# Patient Record
Sex: Female | Born: 1935 | Race: White | Hispanic: No | Smoking: Never smoker
Health system: Southern US, Community
[De-identification: ages and names within clinical notes are randomized; demographics above are authoritative.]

## PROBLEM LIST (undated history)

## (undated) DIAGNOSIS — I495 Sick sinus syndrome: Secondary | ICD-10-CM

## (undated) DIAGNOSIS — E039 Hypothyroidism, unspecified: Secondary | ICD-10-CM

## (undated) DIAGNOSIS — K279 Peptic ulcer, site unspecified, unspecified as acute or chronic, without hemorrhage or perforation: Secondary | ICD-10-CM

## (undated) DIAGNOSIS — F0390 Unspecified dementia without behavioral disturbance: Secondary | ICD-10-CM

## (undated) DIAGNOSIS — D649 Anemia, unspecified: Secondary | ICD-10-CM

## (undated) DIAGNOSIS — J449 Chronic obstructive pulmonary disease, unspecified: Secondary | ICD-10-CM

## (undated) DIAGNOSIS — R627 Adult failure to thrive: Secondary | ICD-10-CM

## (undated) DIAGNOSIS — F32A Depression, unspecified: Secondary | ICD-10-CM

## (undated) DIAGNOSIS — I4891 Unspecified atrial fibrillation: Secondary | ICD-10-CM

## (undated) DIAGNOSIS — R8281 Pyuria: Secondary | ICD-10-CM

## (undated) DIAGNOSIS — F329 Major depressive disorder, single episode, unspecified: Secondary | ICD-10-CM

## (undated) DIAGNOSIS — E119 Type 2 diabetes mellitus without complications: Secondary | ICD-10-CM

## (undated) DIAGNOSIS — E785 Hyperlipidemia, unspecified: Secondary | ICD-10-CM

## (undated) HISTORY — DX: Major depressive disorder, single episode, unspecified: F32.9

## (undated) HISTORY — DX: Peptic ulcer, site unspecified, unspecified as acute or chronic, without hemorrhage or perforation: K27.9

## (undated) HISTORY — DX: Hyperlipidemia, unspecified: E78.5

## (undated) HISTORY — DX: Type 2 diabetes mellitus without complications: E11.9

## (undated) HISTORY — DX: Chronic obstructive pulmonary disease, unspecified: J44.9

## (undated) HISTORY — DX: Sick sinus syndrome: I49.5

## (undated) HISTORY — DX: Depression, unspecified: F32.A

## (undated) HISTORY — DX: Unspecified dementia, unspecified severity, without behavioral disturbance, psychotic disturbance, mood disturbance, and anxiety: F03.90

## (undated) HISTORY — DX: Anemia, unspecified: D64.9

## (undated) HISTORY — DX: Pyuria: R82.81

## (undated) HISTORY — DX: Unspecified atrial fibrillation: I48.91

## (undated) HISTORY — DX: Hypothyroidism, unspecified: E03.9

## (undated) HISTORY — DX: Adult failure to thrive: R62.7

---
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
## 2004-07-09 ENCOUNTER — Emergency Department: Payer: Self-pay | Admitting: Emergency Medicine

## 2004-10-29 ENCOUNTER — Ambulatory Visit: Payer: Self-pay | Admitting: Family Medicine

## 2005-06-10 ENCOUNTER — Ambulatory Visit: Payer: Self-pay | Admitting: Unknown Physician Specialty

## 2006-05-20 ENCOUNTER — Ambulatory Visit: Payer: Self-pay | Admitting: Family Medicine

## 2006-07-06 ENCOUNTER — Other Ambulatory Visit: Payer: Self-pay

## 2006-07-07 ENCOUNTER — Inpatient Hospital Stay: Payer: Self-pay | Admitting: Internal Medicine

## 2006-07-11 ENCOUNTER — Other Ambulatory Visit: Payer: Self-pay

## 2006-10-12 ENCOUNTER — Emergency Department: Payer: Self-pay | Admitting: Emergency Medicine

## 2006-11-19 ENCOUNTER — Emergency Department: Payer: Self-pay

## 2007-02-26 ENCOUNTER — Other Ambulatory Visit: Payer: Self-pay

## 2007-02-26 ENCOUNTER — Emergency Department: Payer: Self-pay | Admitting: Unknown Physician Specialty

## 2007-04-26 ENCOUNTER — Observation Stay: Payer: Self-pay | Admitting: Internal Medicine

## 2007-06-17 ENCOUNTER — Ambulatory Visit: Payer: Self-pay | Admitting: Gastroenterology

## 2007-12-09 ENCOUNTER — Other Ambulatory Visit: Payer: Self-pay

## 2007-12-09 ENCOUNTER — Inpatient Hospital Stay: Payer: Self-pay | Admitting: Internal Medicine

## 2008-02-29 ENCOUNTER — Ambulatory Visit: Payer: Self-pay | Admitting: Family Medicine

## 2008-07-20 ENCOUNTER — Ambulatory Visit: Payer: Self-pay | Admitting: Ophthalmology

## 2008-08-08 ENCOUNTER — Ambulatory Visit: Payer: Self-pay | Admitting: Ophthalmology

## 2009-05-17 ENCOUNTER — Inpatient Hospital Stay: Payer: Self-pay | Admitting: Internal Medicine

## 2009-09-29 ENCOUNTER — Emergency Department: Payer: Self-pay | Admitting: Emergency Medicine

## 2009-09-30 ENCOUNTER — Emergency Department: Payer: Self-pay | Admitting: Emergency Medicine

## 2009-11-01 ENCOUNTER — Inpatient Hospital Stay: Payer: Self-pay | Admitting: Internal Medicine

## 2009-11-21 ENCOUNTER — Emergency Department: Payer: Self-pay | Admitting: Emergency Medicine

## 2009-12-17 ENCOUNTER — Emergency Department: Payer: Self-pay | Admitting: Emergency Medicine

## 2010-01-21 ENCOUNTER — Emergency Department: Payer: Self-pay | Admitting: Emergency Medicine

## 2010-01-30 ENCOUNTER — Inpatient Hospital Stay: Payer: Self-pay | Admitting: Internal Medicine

## 2010-06-09 ENCOUNTER — Emergency Department: Payer: Self-pay | Admitting: Emergency Medicine

## 2010-06-13 ENCOUNTER — Inpatient Hospital Stay: Payer: Self-pay | Admitting: Family Medicine

## 2010-06-19 ENCOUNTER — Inpatient Hospital Stay: Payer: Self-pay | Admitting: Family Medicine

## 2010-07-07 ENCOUNTER — Emergency Department: Payer: Self-pay | Admitting: Emergency Medicine

## 2010-07-08 ENCOUNTER — Emergency Department: Payer: Self-pay | Admitting: Emergency Medicine

## 2010-07-13 ENCOUNTER — Emergency Department: Payer: Self-pay | Admitting: Emergency Medicine

## 2010-07-19 ENCOUNTER — Emergency Department: Payer: Self-pay | Admitting: Emergency Medicine

## 2010-07-24 ENCOUNTER — Observation Stay: Payer: Self-pay | Admitting: Family Medicine

## 2010-07-31 ENCOUNTER — Observation Stay: Payer: Self-pay | Admitting: Family Medicine

## 2010-08-04 ENCOUNTER — Emergency Department: Payer: Self-pay | Admitting: Emergency Medicine

## 2010-08-07 ENCOUNTER — Emergency Department: Payer: Self-pay | Admitting: Emergency Medicine

## 2010-08-08 ENCOUNTER — Emergency Department: Payer: Self-pay | Admitting: Unknown Physician Specialty

## 2010-08-26 ENCOUNTER — Inpatient Hospital Stay: Payer: Self-pay | Admitting: Internal Medicine

## 2010-09-03 ENCOUNTER — Inpatient Hospital Stay: Payer: Self-pay | Admitting: Family Medicine

## 2010-09-10 ENCOUNTER — Emergency Department: Payer: Self-pay | Admitting: Emergency Medicine

## 2010-09-13 ENCOUNTER — Ambulatory Visit: Payer: Self-pay | Admitting: Internal Medicine

## 2010-09-15 ENCOUNTER — Emergency Department: Payer: Self-pay | Admitting: Emergency Medicine

## 2010-09-17 ENCOUNTER — Inpatient Hospital Stay: Payer: Self-pay | Admitting: Family Medicine

## 2010-09-24 ENCOUNTER — Emergency Department: Payer: Self-pay | Admitting: Emergency Medicine

## 2010-10-04 ENCOUNTER — Emergency Department: Payer: Self-pay

## 2010-10-08 ENCOUNTER — Emergency Department: Payer: Self-pay | Admitting: Emergency Medicine

## 2010-10-14 ENCOUNTER — Ambulatory Visit: Payer: Self-pay | Admitting: Internal Medicine

## 2010-10-21 ENCOUNTER — Observation Stay: Payer: Self-pay | Admitting: Family Medicine

## 2010-10-24 ENCOUNTER — Emergency Department: Payer: Self-pay | Admitting: Emergency Medicine

## 2011-02-11 ENCOUNTER — Emergency Department: Payer: Self-pay | Admitting: *Deleted

## 2011-02-25 ENCOUNTER — Emergency Department: Payer: Self-pay | Admitting: *Deleted

## 2011-06-14 IMAGING — CR DG CHEST 1V PORT
1 series · 1 of 1 positions shown · non-contrast
Comparison: none

REASON FOR EXAM: chest pain
COMMENTS:

[view not recorded]
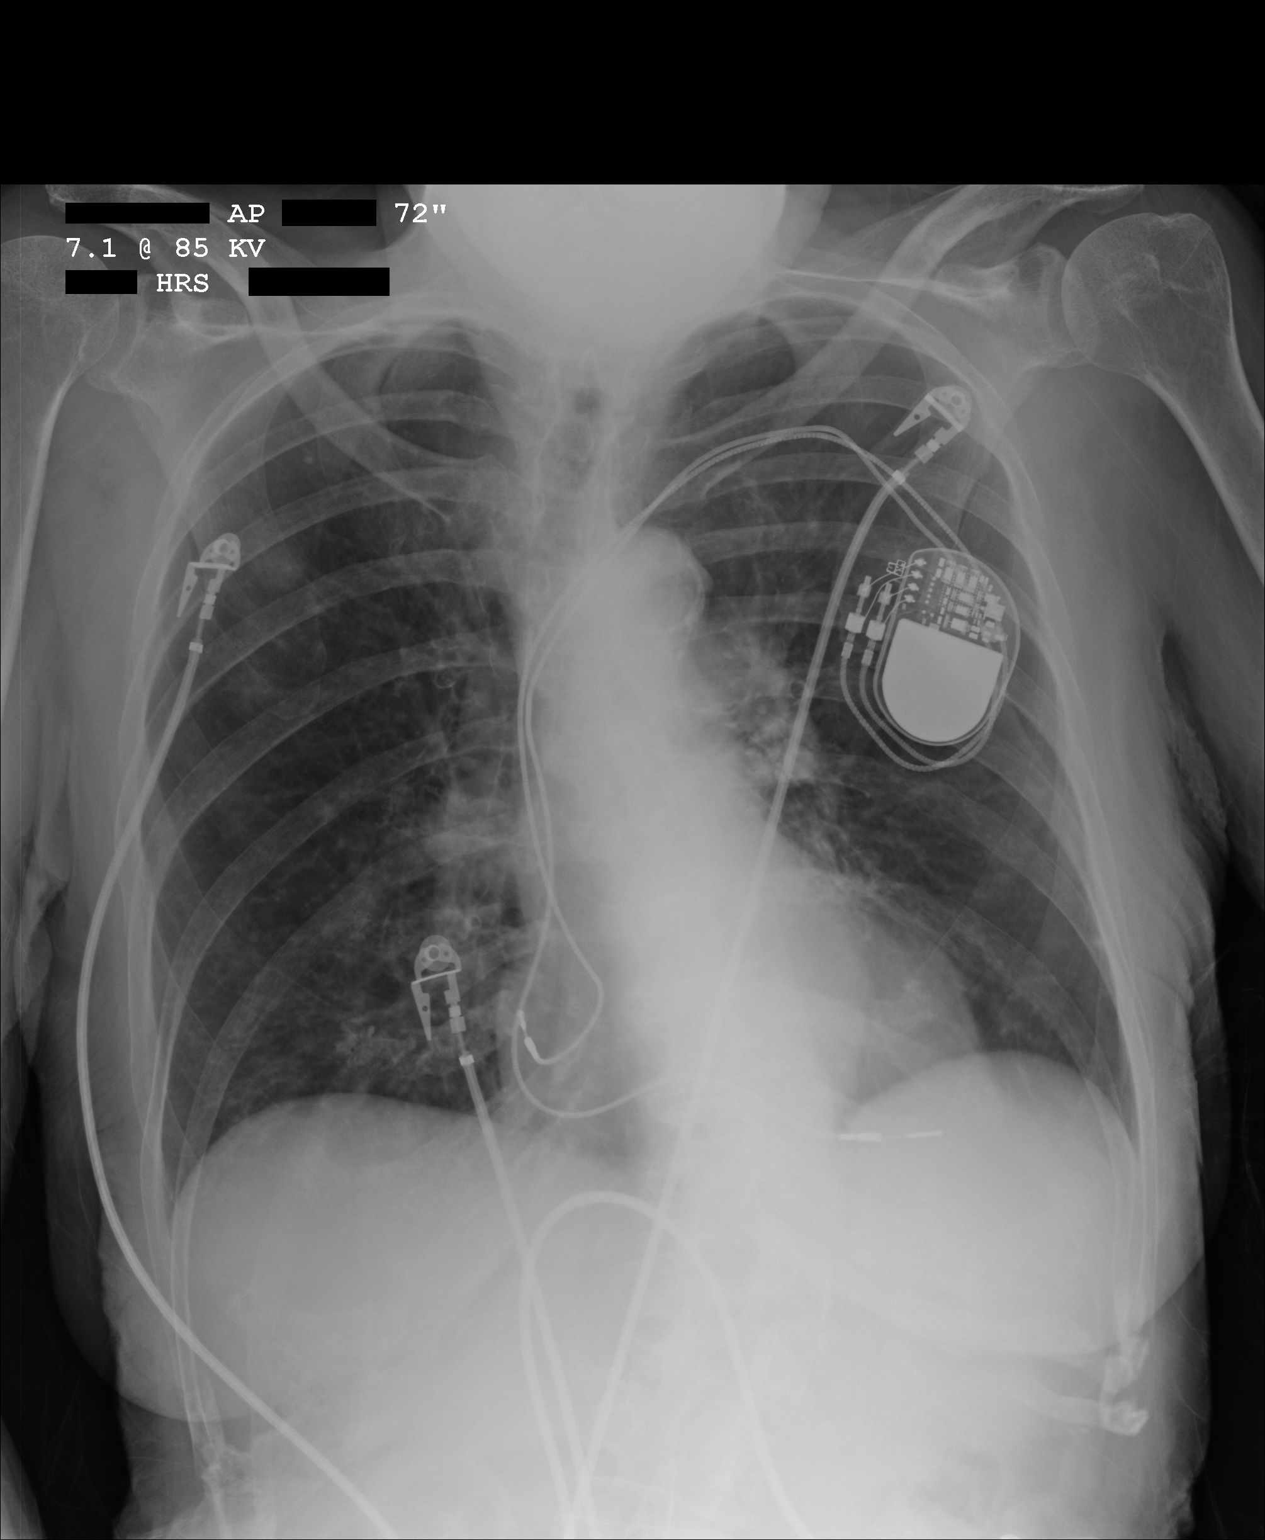

[1 of 1 positions shown; findings below may reference images not displayed]

PROCEDURE:     DXR - DXR PORTABLE CHEST SINGLE VIEW  - July 13, 2010  [DATE]

RESULT:     Comparison is made to the prior exam of 06/24/2010. The lung
fields are clear. The previously present bibasilar pleural effusions are no
longer seen. The previously present subcutaneous emphysema on the left has
cleared. Heart size is normal. A cardiac pacemaker is present.
IMPRESSION: No acute changes are identified.

## 2011-07-02 IMAGING — CT CT HEAD WITHOUT CONTRAST
2 series · 16 of 30 positions shown, 20 images · non-contrast
Comparison: none

REASON FOR EXAM: syncope
COMMENTS:   May transport without cardiac monitor

[Series 2: without · axial · non-contrast · 0.40mm/px · z∈[-622,-486]mm · 13 of 33 slices shown, 17 images]
[im 3/33  brain]
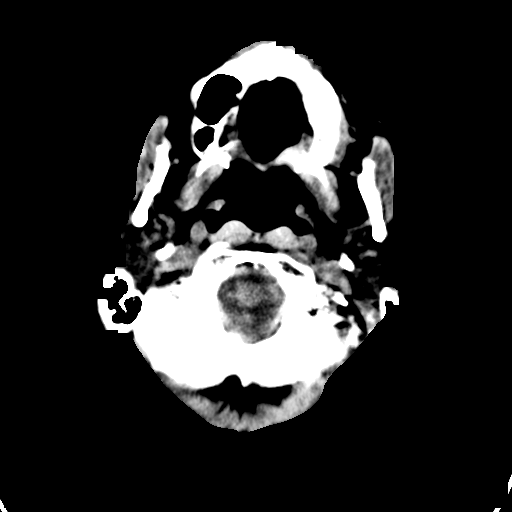
[im 3/33  bone]
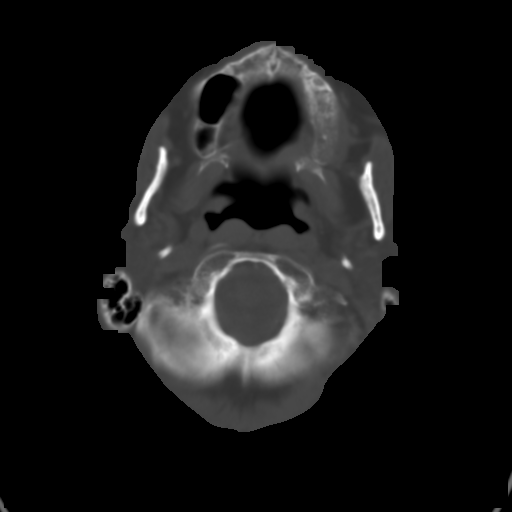
[im 5/33  brain]
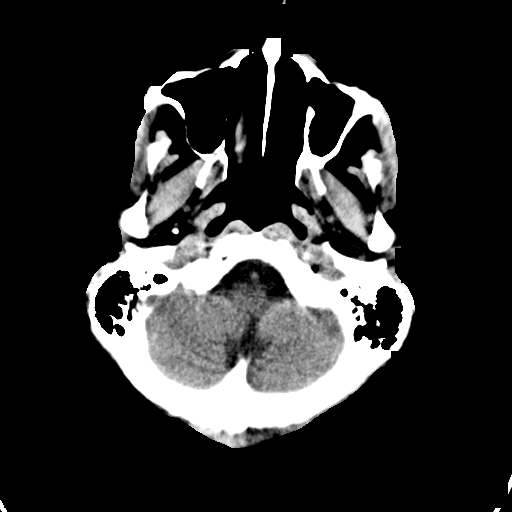
[im 7/33  brain]
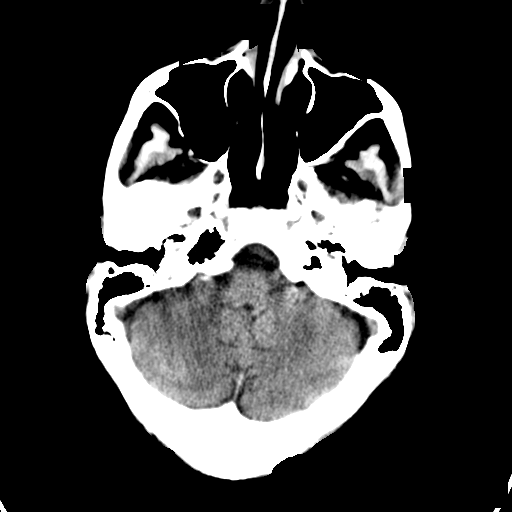
[im 10/33  brain]
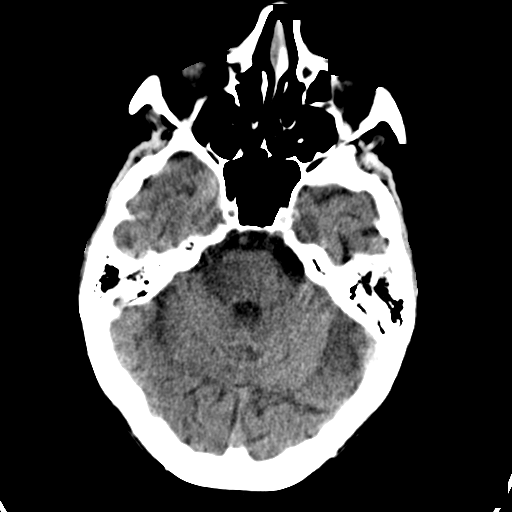
[im 12/33  brain]
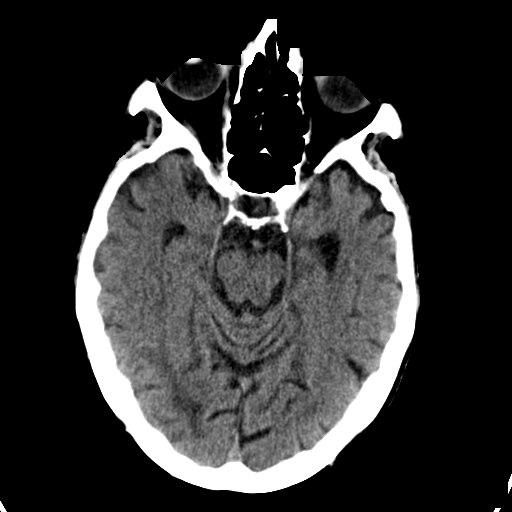
[im 12/33  bone]
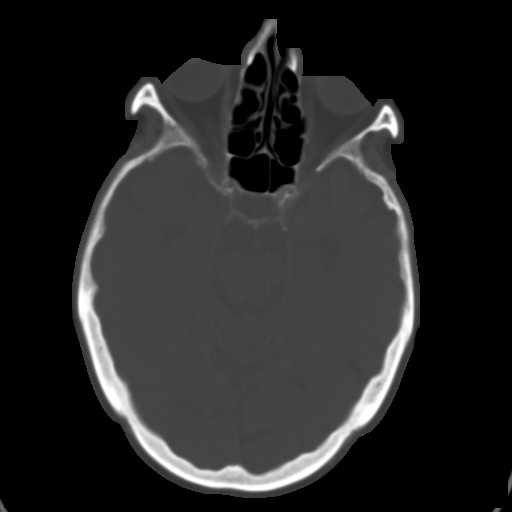
[im 14/33  brain]
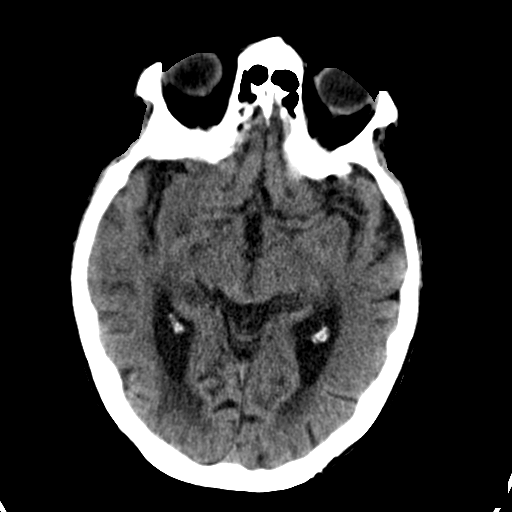
[im 17/33  brain]
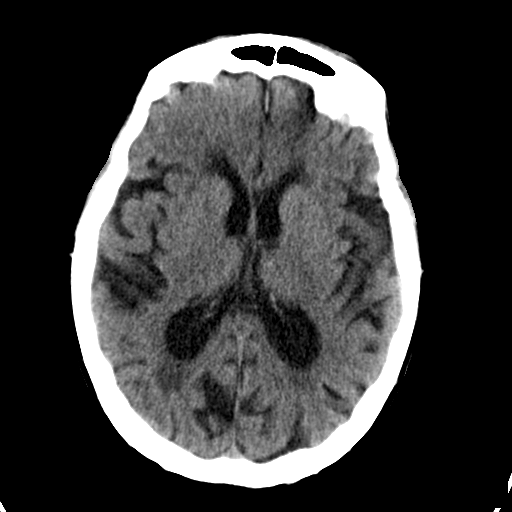
[im 19/33  brain]
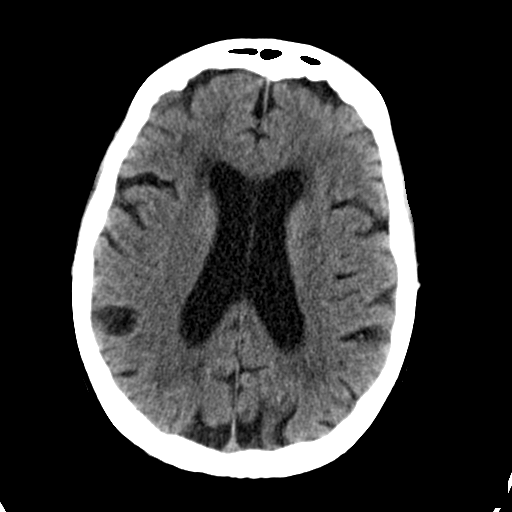
[im 21/33  brain]
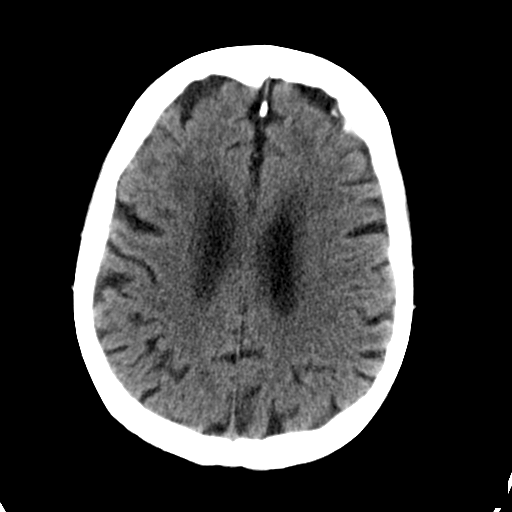
[im 21/33  bone]
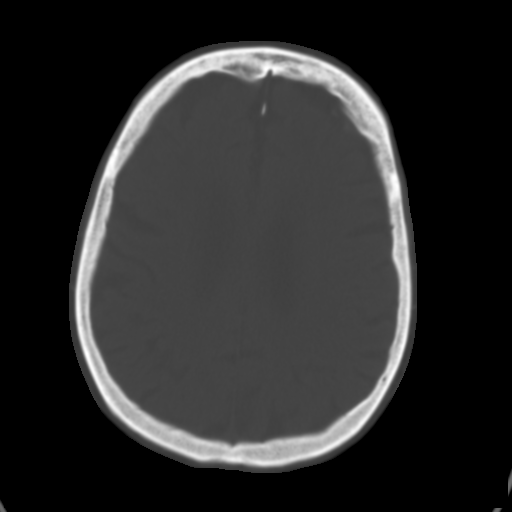
[im 23/33  brain]
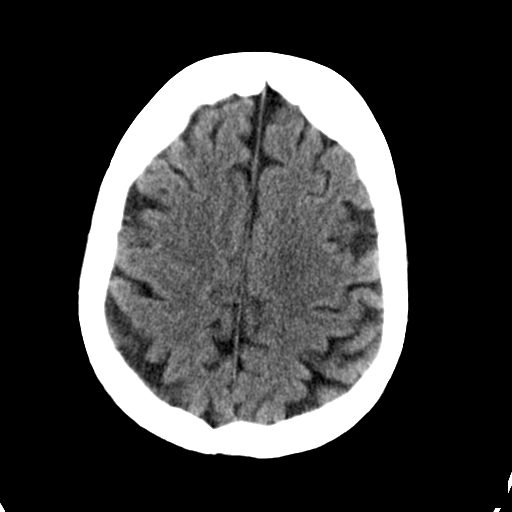
[im 26/33  brain]
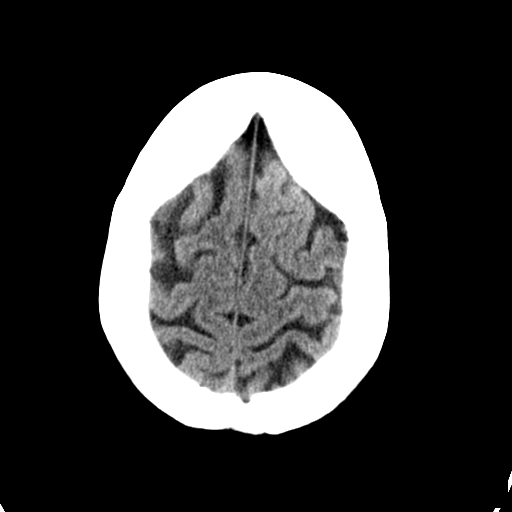
[im 28/33  brain]
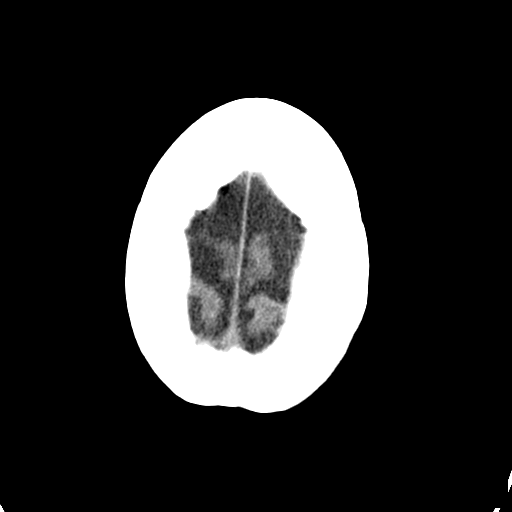
[im 30/33  brain]
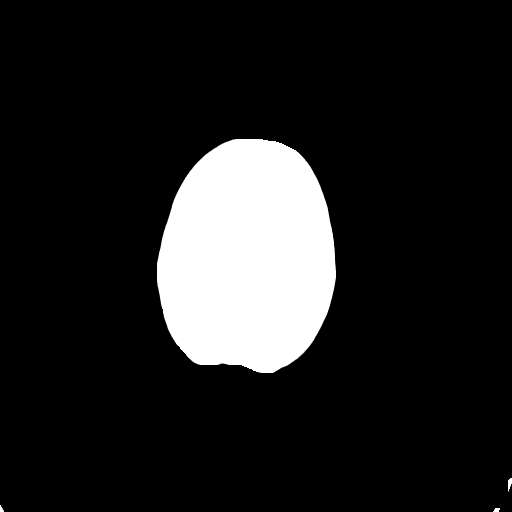
[im 30/33  bone]
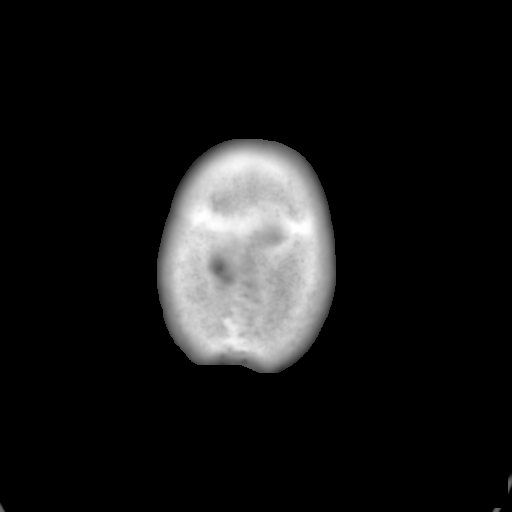

[Series 3: bone · axial · 0.40mm/px · z∈[-622,-576]mm · 3 of 33 slices shown]
[im 3/33  bone]
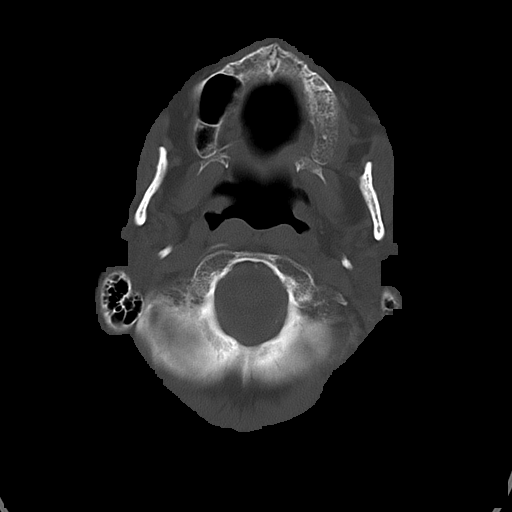
[im 7/33  bone]
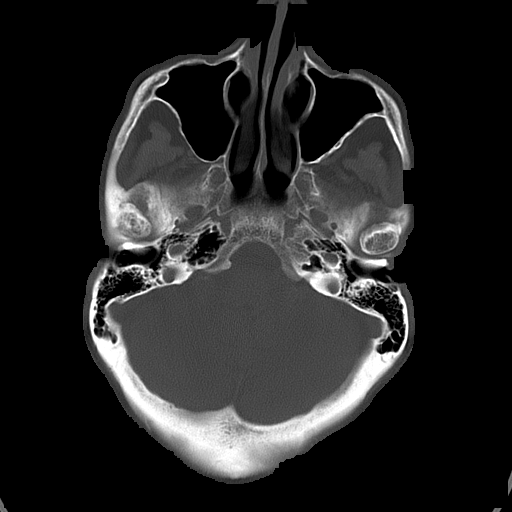
[im 12/33  bone]
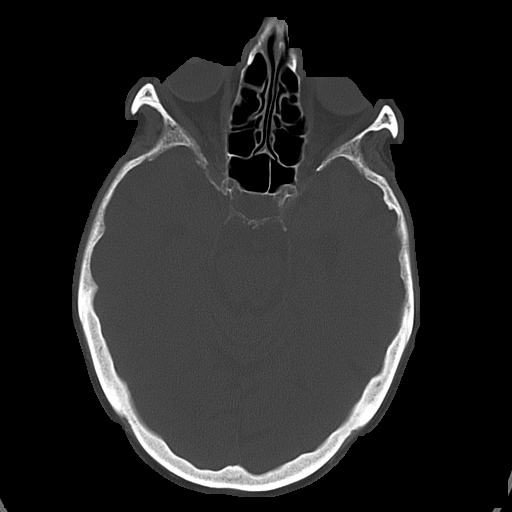

[16 of 30 positions shown; findings below may reference images not displayed]

PROCEDURE:     CT  - CT HEAD WITHOUT CONTRAST  - July 31, 2010 [DATE]

RESULT:     Noncontrast emergent CT of the brain is compared to the most
recent CT dated 06/19/2010.

There is prominence of the ventricles and sulci consistent with atrophy with
low attenuation demonstrated bilaterally in the basal ganglia most
consistent with lacunar infarcts. There is low-attenuation diffusely within
the periventricular and subcortical white matter consistent with chronic
small vessel ischemic disease. The included sinuses and mastoid air cells
demonstrate normal aeration. The calvarium is intact.
IMPRESSION: 1. Atrophy with chronic small vessel ischemic disease. No acute intracranial
abnormality evident. Stable appearance. Bilateral basal ganglia lacunar
infarcts.

## 2011-07-29 IMAGING — CR DG CHEST 2V
1 series · 2 of 2 positions shown · non-contrast
Comparison: none

REASON FOR EXAM: SOB
COMMENTS:

[Series 1: view not recorded · 0.17mm/px · 2 of 2 slices shown]
[im 1/2]
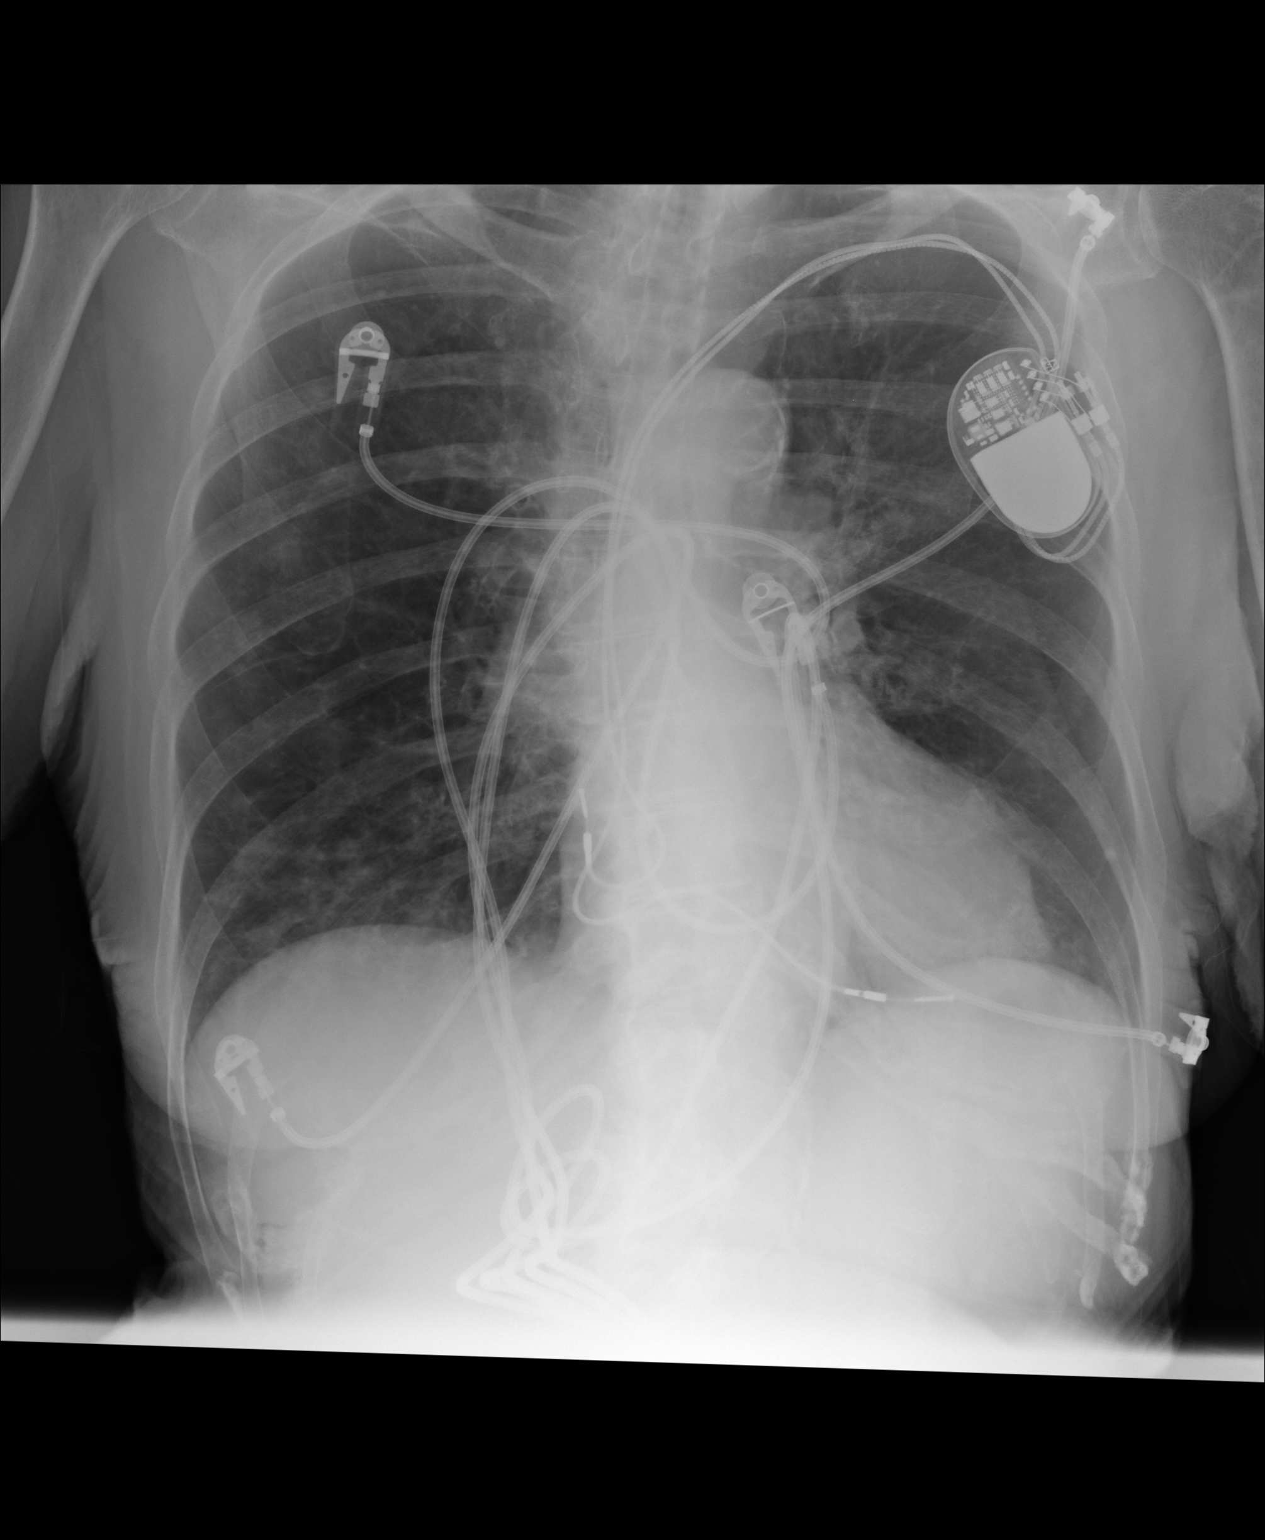
[im 2/2]
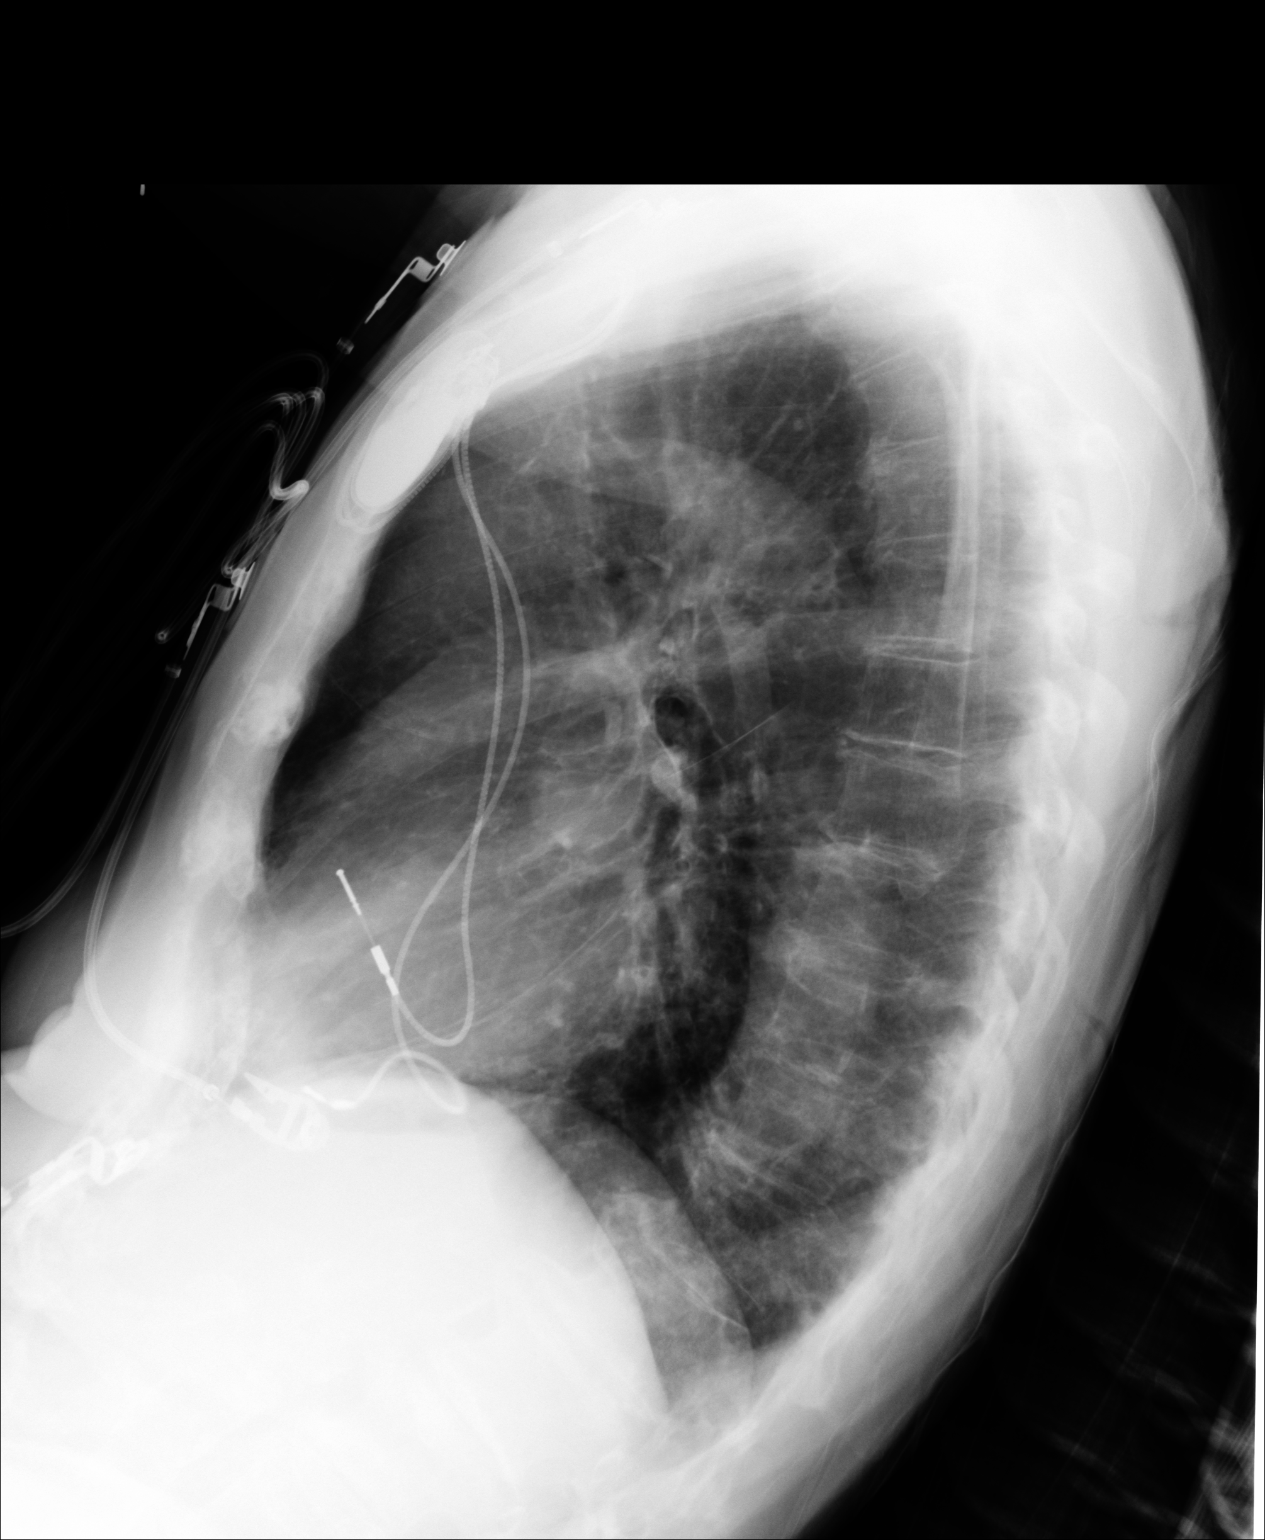

[2 of 2 positions shown; findings below may reference images not displayed]

PROCEDURE:     DXR - DXR CHEST PA (OR AP) AND LATERAL  - August 27, 2010  [DATE]

RESULT:     Comparison is made to the prior exam of 08/26/2010.

There is a minimal increase in density at the right base, likely due to
minimal atelectasis. No consolidated infiltrates are seen. No frank
pulmonary edema is noted. The heart size is normal. No pleural effusion is
evident. A cardiac pacemaker is present. The chest bilaterally is
hyperinflated.
IMPRESSION: Stable appearing chest as compared to the prior exam of
yesterday.

## 2011-12-17 ENCOUNTER — Ambulatory Visit: Payer: Self-pay | Admitting: Gynecologic Oncology

## 2011-12-23 LAB — PATHOLOGY REPORT

## 2012-01-13 ENCOUNTER — Ambulatory Visit: Payer: Self-pay | Admitting: Gynecologic Oncology

## 2012-04-06 ENCOUNTER — Emergency Department: Payer: Self-pay | Admitting: Unknown Physician Specialty

## 2012-04-06 LAB — CBC
HGB: 11.4 g/dL — ABNORMAL LOW (ref 12.0–16.0)
MCH: 24.9 pg — ABNORMAL LOW (ref 26.0–34.0)
MCV: 76 fL — ABNORMAL LOW (ref 80–100)
Platelet: 417 10*3/uL (ref 150–440)
RBC: 4.58 10*6/uL (ref 3.80–5.20)

## 2012-04-06 LAB — COMPREHENSIVE METABOLIC PANEL
Anion Gap: 7 (ref 7–16)
BUN: 18 mg/dL (ref 7–18)
Bilirubin,Total: 0.2 mg/dL (ref 0.2–1.0)
Calcium, Total: 9.2 mg/dL (ref 8.5–10.1)
Chloride: 100 mmol/L (ref 98–107)
Co2: 30 mmol/L (ref 21–32)
Creatinine: 0.82 mg/dL (ref 0.60–1.30)
EGFR (African American): >60
EGFR (Non-African Amer.): >60
Osmolality: 280 (ref 275–301)
Potassium: 4.4 mmol/L (ref 3.5–5.1)
SGPT (ALT): 20 U/L (ref 12–78)
Sodium: 137 mmol/L (ref 136–145)
Total Protein: 7.9 g/dL (ref 6.4–8.2)

## 2012-04-06 LAB — URINALYSIS, COMPLETE
Blood: NEGATIVE
Glucose,UR: 50 mg/dL (ref 0–75)
Nitrite: NEGATIVE
Ph: 5 (ref 4.5–8.0)
Squamous Epithelial: 1

## 2012-04-09 ENCOUNTER — Emergency Department: Payer: Self-pay | Admitting: *Deleted

## 2012-04-10 ENCOUNTER — Emergency Department: Payer: Self-pay | Admitting: Emergency Medicine

## 2012-04-10 LAB — CBC
HGB: 10.4 g/dL — ABNORMAL LOW (ref 12.0–16.0)
MCH: 23.8 pg — ABNORMAL LOW (ref 26.0–34.0)
MCHC: 31.2 g/dL — ABNORMAL LOW (ref 32.0–36.0)
MCV: 76 fL — ABNORMAL LOW (ref 80–100)
RDW: 16.3 % — ABNORMAL HIGH (ref 11.5–14.5)

## 2012-04-10 LAB — URINALYSIS, COMPLETE
Glucose,UR: NEGATIVE mg/dL (ref 0–75)
Ketone: NEGATIVE
Nitrite: NEGATIVE
Ph: 5 (ref 4.5–8.0)
Protein: NEGATIVE
RBC,UR: 1 /HPF (ref 0–5)
Specific Gravity: 1.015 (ref 1.003–1.030)
WBC UR: <1 /HPF (ref 0–5)

## 2012-04-10 LAB — BASIC METABOLIC PANEL
Anion Gap: 7 (ref 7–16)
BUN: 18 mg/dL (ref 7–18)
Chloride: 102 mmol/L (ref 98–107)
Creatinine: 0.88 mg/dL (ref 0.60–1.30)
EGFR (African American): >60
Glucose: 149 mg/dL — ABNORMAL HIGH (ref 65–99)
Osmolality: 286 (ref 275–301)
Potassium: 4.3 mmol/L (ref 3.5–5.1)

## 2012-04-10 LAB — LIPASE, BLOOD: Lipase: 109 U/L (ref 73–393)

## 2012-04-28 ENCOUNTER — Emergency Department: Payer: Self-pay | Admitting: Emergency Medicine

## 2012-04-28 LAB — URINALYSIS, COMPLETE
Glucose,UR: NEGATIVE mg/dL (ref 0–75)
Ketone: NEGATIVE
Ph: 5 (ref 4.5–8.0)
Protein: 30
Specific Gravity: 1.019 (ref 1.003–1.030)
WBC UR: 631 /HPF (ref 0–5)

## 2012-04-28 LAB — CBC WITH DIFFERENTIAL/PLATELET
Basophil #: 0 10*3/uL (ref 0.0–0.1)
Eosinophil #: 0.4 10*3/uL (ref 0.0–0.7)
HGB: 10.6 g/dL — ABNORMAL LOW (ref 12.0–16.0)
Lymphocyte %: 31.7 %
MCV: 77 fL — ABNORMAL LOW (ref 80–100)
Monocyte #: 0.8 x10 3/mm (ref 0.2–0.9)
Neutrophil %: 55.2 %
Platelet: 289 10*3/uL (ref 150–440)
RDW: 16.3 % — ABNORMAL HIGH (ref 11.5–14.5)
WBC: 10 10*3/uL (ref 3.6–11.0)

## 2012-04-28 LAB — BASIC METABOLIC PANEL
Anion Gap: 4 — ABNORMAL LOW (ref 7–16)
Chloride: 104 mmol/L (ref 98–107)
Co2: 31 mmol/L (ref 21–32)
EGFR (African American): >60
Potassium: 4.2 mmol/L (ref 3.5–5.1)
Sodium: 139 mmol/L (ref 136–145)

## 2012-06-09 ENCOUNTER — Ambulatory Visit: Payer: Self-pay | Admitting: Obstetrics and Gynecology

## 2012-06-14 ENCOUNTER — Ambulatory Visit: Payer: Self-pay | Admitting: Internal Medicine

## 2012-06-15 ENCOUNTER — Inpatient Hospital Stay: Payer: Self-pay | Admitting: Obstetrics and Gynecology

## 2012-06-16 LAB — BASIC METABOLIC PANEL
Calcium, Total: 8.2 mg/dL — ABNORMAL LOW (ref 8.5–10.1)
Co2: 27 mmol/L (ref 21–32)
Creatinine: 0.92 mg/dL (ref 0.60–1.30)
EGFR (African American): >60
EGFR (Non-African Amer.): 56 — ABNORMAL LOW
Glucose: 150 mg/dL — ABNORMAL HIGH (ref 65–99)
Potassium: 3.9 mmol/L (ref 3.5–5.1)
Sodium: 138 mmol/L (ref 136–145)

## 2012-06-16 LAB — CBC WITH DIFFERENTIAL/PLATELET
Basophil %: 0.1 %
Basophil %: 0.3 %
Eosinophil #: 0 10*3/uL (ref 0.0–0.7)
Eosinophil #: 0 10*3/uL (ref 0.0–0.7)
Eosinophil %: 0.1 %
Eosinophil %: 0.2 %
HCT: 28.5 % — ABNORMAL LOW (ref 35.0–47.0)
HGB: 8.9 g/dL — ABNORMAL LOW (ref 12.0–16.0)
HGB: 9.7 g/dL — ABNORMAL LOW (ref 12.0–16.0)
Lymphocyte #: 1.8 10*3/uL (ref 1.0–3.6)
Lymphocyte #: 2 10*3/uL (ref 1.0–3.6)
Lymphocyte %: 8.1 %
MCH: 24.1 pg — ABNORMAL LOW (ref 26.0–34.0)
MCHC: 32 g/dL (ref 32.0–36.0)
MCV: 76 fL — ABNORMAL LOW (ref 80–100)
Monocyte #: 1.1 x10 3/mm — ABNORMAL HIGH (ref 0.2–0.9)
Monocyte #: 0.7 x10 3/mm (ref 0.2–0.9)
Monocyte %: 4.8 %
Neutrophil #: 19 10*3/uL — ABNORMAL HIGH (ref 1.4–6.5)
Platelet: 308 10*3/uL (ref 150–440)
RBC: 3.75 10*6/uL — ABNORMAL LOW (ref 3.80–5.20)
RBC: 4.03 10*6/uL (ref 3.80–5.20)
RDW: 16.6 % — ABNORMAL HIGH (ref 11.5–14.5)
WBC: 21.8 10*3/uL — ABNORMAL HIGH (ref 3.6–11.0)
WBC: 20.3 10*3/uL — ABNORMAL HIGH (ref 3.6–11.0)

## 2012-06-16 LAB — URINALYSIS, COMPLETE
Bilirubin,UR: NEGATIVE
Blood: NEGATIVE
Glucose,UR: NEGATIVE mg/dL (ref 0–75)
Ketone: NEGATIVE
Nitrite: NEGATIVE
Ph: 6 (ref 4.5–8.0)
Protein: NEGATIVE
RBC,UR: <1 /HPF (ref 0–5)

## 2012-06-16 LAB — CLOSTRIDIUM DIFFICILE BY PCR

## 2012-06-17 LAB — CBC WITH DIFFERENTIAL/PLATELET
Basophil %: 0.3 %
Eosinophil %: 1.4 %
HGB: 8.5 g/dL — ABNORMAL LOW (ref 12.0–16.0)
Lymphocyte %: 10.4 %
MCHC: 31.9 g/dL — ABNORMAL LOW (ref 32.0–36.0)
MCV: 76 fL — ABNORMAL LOW (ref 80–100)
Neutrophil %: 84.6 %
Platelet: 259 10*3/uL (ref 150–440)
RBC: 3.49 10*6/uL — ABNORMAL LOW (ref 3.80–5.20)
WBC: 15.9 10*3/uL — ABNORMAL HIGH (ref 3.6–11.0)

## 2012-06-17 LAB — BASIC METABOLIC PANEL
Anion Gap: 5 — ABNORMAL LOW (ref 7–16)
BUN: 11 mg/dL (ref 7–18)
Chloride: 104 mmol/L (ref 98–107)
EGFR (African American): >60
EGFR (Non-African Amer.): >60
Glucose: 90 mg/dL (ref 65–99)
Osmolality: 273 (ref 275–301)

## 2012-06-17 LAB — PATHOLOGY REPORT

## 2012-06-17 LAB — COMPREHENSIVE METABOLIC PANEL
Bilirubin,Total: 0.4 mg/dL (ref 0.2–1.0)
SGOT(AST): 17 U/L (ref 15–37)
Total Protein: 5.6 g/dL — ABNORMAL LOW (ref 6.4–8.2)

## 2012-06-18 LAB — BASIC METABOLIC PANEL
Calcium, Total: 8.2 mg/dL — ABNORMAL LOW (ref 8.5–10.1)
Co2: 27 mmol/L (ref 21–32)
EGFR (African American): >60
EGFR (Non-African Amer.): >60
Glucose: 126 mg/dL — ABNORMAL HIGH (ref 65–99)
Osmolality: 279 (ref 275–301)
Potassium: 3.4 mmol/L — ABNORMAL LOW (ref 3.5–5.1)
Sodium: 140 mmol/L (ref 136–145)

## 2012-06-18 LAB — CBC WITH DIFFERENTIAL/PLATELET
Basophil %: 0.2 %
Eosinophil #: 0.6 10*3/uL (ref 0.0–0.7)
Eosinophil %: 3.7 %
HCT: 32.6 % — ABNORMAL LOW (ref 35.0–47.0)
HGB: 10.8 g/dL — ABNORMAL LOW (ref 12.0–16.0)
Lymphocyte #: 1.4 10*3/uL (ref 1.0–3.6)
Lymphocyte %: 9.2 %
MCH: 25.9 pg — ABNORMAL LOW (ref 26.0–34.0)
MCHC: 32.9 g/dL (ref 32.0–36.0)
MCV: 79 fL — ABNORMAL LOW (ref 80–100)
Monocyte #: 1 x10 3/mm — ABNORMAL HIGH (ref 0.2–0.9)
Neutrophil %: 80.5 %
Platelet: 281 10*3/uL (ref 150–440)
RBC: 4.16 10*6/uL (ref 3.80–5.20)
WBC: 15.3 10*3/uL — ABNORMAL HIGH (ref 3.6–11.0)

## 2012-06-19 LAB — CBC WITH DIFFERENTIAL/PLATELET
Basophil #: 0.1 10*3/uL (ref 0.0–0.1)
Basophil %: 0.8 %
Eosinophil %: 4.1 %
HCT: 33.8 % — ABNORMAL LOW (ref 35.0–47.0)
HGB: 10.9 g/dL — ABNORMAL LOW (ref 12.0–16.0)
MCH: 25.3 pg — ABNORMAL LOW (ref 26.0–34.0)
MCV: 79 fL — ABNORMAL LOW (ref 80–100)
Monocyte #: 1.4 x10 3/mm — ABNORMAL HIGH (ref 0.2–0.9)
Monocyte %: 9.7 %
Platelet: 329 10*3/uL (ref 150–440)
RBC: 4.29 10*6/uL (ref 3.80–5.20)
WBC: 14.5 10*3/uL — ABNORMAL HIGH (ref 3.6–11.0)

## 2012-06-19 LAB — BASIC METABOLIC PANEL
Anion Gap: 8 (ref 7–16)
Calcium, Total: 8.3 mg/dL — ABNORMAL LOW (ref 8.5–10.1)
Chloride: 106 mmol/L (ref 98–107)
Co2: 26 mmol/L (ref 21–32)
Creatinine: 0.72 mg/dL (ref 0.60–1.30)
Glucose: 164 mg/dL — ABNORMAL HIGH (ref 65–99)
Osmolality: 281 (ref 275–301)

## 2012-06-22 LAB — CULTURE, BLOOD (SINGLE)

## 2012-07-14 ENCOUNTER — Emergency Department: Payer: Self-pay | Admitting: Emergency Medicine

## 2012-07-14 LAB — URINALYSIS, COMPLETE
Bilirubin,UR: NEGATIVE
Blood: NEGATIVE
Glucose,UR: NEGATIVE mg/dL (ref 0–75)
Ketone: NEGATIVE
Nitrite: NEGATIVE
Protein: 30
Specific Gravity: 1.018 (ref 1.003–1.030)

## 2012-07-15 ENCOUNTER — Ambulatory Visit: Payer: Self-pay | Admitting: Internal Medicine

## 2012-09-08 ENCOUNTER — Telehealth: Payer: Self-pay | Admitting: Internal Medicine

## 2012-09-08 NOTE — Telephone Encounter (Signed)
Caller: Donna/Other; Phone: [phone]; Reason for Call: Kendra Sutton is calling.  States patient has a new patient appt.  Scheduled with Dr.  Lorin Picket on 09/11/12.  Care giver is inquiring about what patient needs to bring with her to the initial office visit.  Care giver advised to bring all medications and insurance information.  Call transferred to [place] in office for additional information regarding new patient appt.

## 2012-09-11 ENCOUNTER — Encounter: Payer: Self-pay | Admitting: Internal Medicine

## 2012-09-11 ENCOUNTER — Inpatient Hospital Stay: Payer: Self-pay | Admitting: Internal Medicine

## 2012-09-11 ENCOUNTER — Ambulatory Visit (INDEPENDENT_AMBULATORY_CARE_PROVIDER_SITE_OTHER): Payer: Medicare Other | Admitting: Internal Medicine

## 2012-09-11 VITALS — BP 140/60 | HR 73 | Temp 97.7°F | Ht 62.0 in | Wt 115.0 lb

## 2012-09-11 DIAGNOSIS — J441 Chronic obstructive pulmonary disease with (acute) exacerbation: Secondary | ICD-10-CM

## 2012-09-11 DIAGNOSIS — R82998 Other abnormal findings in urine: Secondary | ICD-10-CM

## 2012-09-11 DIAGNOSIS — N9489 Other specified conditions associated with female genital organs and menstrual cycle: Secondary | ICD-10-CM

## 2012-09-11 DIAGNOSIS — I495 Sick sinus syndrome: Secondary | ICD-10-CM

## 2012-09-11 DIAGNOSIS — E119 Type 2 diabetes mellitus without complications: Secondary | ICD-10-CM

## 2012-09-11 DIAGNOSIS — E039 Hypothyroidism, unspecified: Secondary | ICD-10-CM

## 2012-09-11 DIAGNOSIS — K279 Peptic ulcer, site unspecified, unspecified as acute or chronic, without hemorrhage or perforation: Secondary | ICD-10-CM

## 2012-09-11 DIAGNOSIS — R627 Adult failure to thrive: Secondary | ICD-10-CM

## 2012-09-11 DIAGNOSIS — F0390 Unspecified dementia without behavioral disturbance: Secondary | ICD-10-CM

## 2012-09-11 DIAGNOSIS — E785 Hyperlipidemia, unspecified: Secondary | ICD-10-CM

## 2012-09-11 DIAGNOSIS — D649 Anemia, unspecified: Secondary | ICD-10-CM

## 2012-09-11 DIAGNOSIS — I4891 Unspecified atrial fibrillation: Secondary | ICD-10-CM

## 2012-09-11 DIAGNOSIS — R0902 Hypoxemia: Secondary | ICD-10-CM

## 2012-09-11 LAB — COMPREHENSIVE METABOLIC PANEL
Albumin: 3.6 g/dL (ref 3.4–5.0)
Alkaline Phosphatase: 104 U/L (ref 50–136)
Anion Gap: 1 — ABNORMAL LOW (ref 7–16)
BUN: 15 mg/dL (ref 7–18)
Chloride: 99 mmol/L (ref 98–107)
Potassium: 4.5 mmol/L (ref 3.5–5.1)
SGOT(AST): 33 U/L (ref 15–37)
SGPT (ALT): 24 U/L (ref 12–78)
Sodium: 135 mmol/L — ABNORMAL LOW (ref 136–145)
Total Protein: 8.3 g/dL — ABNORMAL HIGH (ref 6.4–8.2)

## 2012-09-11 LAB — PRO B NATRIURETIC PEPTIDE: B-Type Natriuretic Peptide: 469 pg/mL — ABNORMAL HIGH (ref 0–450)

## 2012-09-11 LAB — PROTIME-INR
INR: 0.9
Prothrombin Time: 12.7 secs (ref 11.5–14.7)

## 2012-09-11 LAB — CBC
HCT: 37.7 % (ref 35.0–47.0)
MCHC: 32.2 g/dL (ref 32.0–36.0)
MCV: 82 fL (ref 80–100)
Platelet: 327 10*3/uL (ref 150–440)
RDW: 17 % — ABNORMAL HIGH (ref 11.5–14.5)

## 2012-09-11 LAB — TROPONIN I: Troponin-I: <0.02 ng/mL

## 2012-09-11 LAB — CK TOTAL AND CKMB (NOT AT ARMC)
CK, Total: 65 U/L (ref 21–215)
CK, Total: 91 U/L (ref 21–215)
CK-MB: 0.6 ng/mL (ref 0.5–3.6)
CK-MB: 1 ng/mL (ref 0.5–3.6)

## 2012-09-12 LAB — CK TOTAL AND CKMB (NOT AT ARMC)
CK, Total: 80 U/L (ref 21–215)
CK-MB: 1.1 ng/mL (ref 0.5–3.6)

## 2012-09-13 ENCOUNTER — Encounter: Payer: Self-pay | Admitting: Internal Medicine

## 2012-09-13 DIAGNOSIS — E039 Hypothyroidism, unspecified: Secondary | ICD-10-CM | POA: Insufficient documentation

## 2012-09-13 DIAGNOSIS — F0390 Unspecified dementia without behavioral disturbance: Secondary | ICD-10-CM | POA: Insufficient documentation

## 2012-09-13 DIAGNOSIS — E785 Hyperlipidemia, unspecified: Secondary | ICD-10-CM | POA: Insufficient documentation

## 2012-09-13 NOTE — Assessment & Plan Note (Signed)
Has O2.  Only uses when lying down.  Will refer to ER for treatment of the underlying infection and see if oxygenation improves.

## 2012-09-13 NOTE — Assessment & Plan Note (Signed)
**Note De-identified  Obfuscation** Refuses treatment

## 2012-09-13 NOTE — Assessment & Plan Note (Signed)
On thyroid replacement.  Follow tsh.  

## 2012-09-13 NOTE — Assessment & Plan Note (Signed)
Previous positive H. Pylori.  Treated. Currently asymptomatic.   

## 2012-09-13 NOTE — Progress Notes (Signed)
Subjective:    Patient ID: Kendra Sutton, female    DOB: [birth year], 79 y.o.   MRN: [id number]  HPI 79 year old female with past history of COPD on oxygen while she sleeps, peptic ulcer disease, hyperlipidemia, hypothyroidism, diabetes, sick sinus syndrome s/p pacemaker placement, depression and dementia.  She has also been followed by Dr Feliberto Gottron for an endometrial mass.  She comes in today to follow up on these issues as well as to establish care.  Former pt of Dr Cordelia Poche.  She is accompanied by her caretaker.  History obtained from both of them.  Caretaker reports that regarding the endometrial mass, there is nothing more that can be done.  Was previously set up with hospice.  She was discharged.  Has copd.  The caretaker reports that over the last 1-2 weeks, she has become increasingly more sob.  Now is unable to walk from her bathroom to the bedroom without having increased sob.  Increased cough and has episodes where it is hard for her to catch her breath secondary to the cough.  Decreased appetite.  No vomiting.  Bowels stable.  Uses advair and ventolin. She lives with her two sons.  One son is gone a lot.  Is a truck driver.     Past Medical History  Diagnosis Date  . COPD (chronic obstructive pulmonary disease)   . Pyuria     chronic  . Peptic ulcer     H. pylori per biopsy 7/03 - treated  . Hyperlipidemia   . Hypothyroidism   . Diabetes     refuses treatment  . Sick sinus syndrome     s/p pacemaker - Dr Darrold Junker  . Depression   . Dementia   . FTT (failure to thrive) in adult   . Atrial fibrillation   . Anemia     Outpatient Encounter Prescriptions as of 09/11/2012  Medication Sig Dispense Refill  . albuterol (PROVENTIL) (2.5 MG/3ML) 0.083% nebulizer solution Take 2.5 mg by nebulization every 6 (six) hours as needed for wheezing.      Marland Kitchen aspirin 81 MG tablet Take 81 mg by mouth daily.      Marland Kitchen dextromethorphan 15 MG/5ML syrup Take 10 mLs by mouth 4 (four) times daily as  needed for cough.      . Fluticasone-Salmeterol (ADVAIR) 100-50 MCG/DOSE AEPB Inhale 1 puff into the lungs every 12 (twelve) hours.      Marland Kitchen levothyroxine (SYNTHROID, LEVOTHROID) 75 MCG tablet Take 75 mcg by mouth daily.      . metoprolol (LOPRESSOR) 50 MG tablet Take 50 mg by mouth 2 (two) times daily.      . Multiple Vitamin (MULTIVITAMIN) tablet Take 1 tablet by mouth daily.      Marland Kitchen senna (SENOKOT) 176 MG/5ML SYRP One to Two tablets by mouth twice a day      . traZODone (DESYREL) 50 MG tablet Take 50 mg by mouth at bedtime as needed.        No facility-administered encounter medications on file as of 09/11/2012.    Review of Systems Patient denies any headache, lightheadedness or dizziness.  Does have some increased nasal congestion.  No documented fever.  Not wearing oxygen.  Caretaker states she only uses this when she is lying down.   No chest pain, tightness or palpitations currently, but has had some intermittent episodes of chest tightness.   Increased shortness of breath as outlined.  Increased cough and congestion.  No nausea or vomiting.  No abdominal pain  or cramping.  Does report decreased appetite and decreased po intake.  No bowel change, such as diarrhea, constipation, BRBPR or melana.  No urine change.  Apparently has reoccurring uti's.      Objective:   Physical Exam Filed Vitals:   09/11/12 0901  BP: 140/60  Pulse: 73  Temp: 97.7 F (31.74 C)   78 year old female in no acute distress.   HEENT:  Nares- clear.  Oropharynx - without lesions. NECK:  Supple.  Nontender.  No audible bruit.  HEART:  Appears to be regular. LUNGS:  Increased congestion.  Increased cough with expiration especially forced expiration.    RADIAL PULSE:  Equal bilaterally.  ABDOMEN:  Soft, nontender.  Bowel sounds present and normal.  No audible abdominal bruit.   EXTREMITIES:  No increased edema present.  DP pulses palpable and equal bilaterally.      SKIN:  No lesions or rash.       Assessment &  Plan:  CARDIOVASCULAR.  With intermittent chest tightness.  Having increased cough and congestion.  Will need to confirm etiology.  Pulmonary vs cardiac or both.  Needs further testing, etc.  Will transfer her over to the ER for evaluation and further treatment.  Discussed with caretaker and son.  Both in agreement.  Discussed with ER.    I spent over 45 minutes with this patient, caretaker and son.  More than 50% of the time was spent in consultation regarding the above.

## 2012-09-13 NOTE — Assessment & Plan Note (Signed)
Refuses treatment.   Does not check sugars.  Obtain outside records.

## 2012-09-13 NOTE — Assessment & Plan Note (Signed)
Refuses treatment.  Follow.   

## 2012-09-13 NOTE — Assessment & Plan Note (Signed)
Has known COPD.  With increased cough and congestion and worsening sob.  Now unable to walk short distances without getting sob.  Needs cxr and further w/up.  This was discussed with the caretaker and the pts son.  Will transfer over to the ER for evaluation and treatment.

## 2012-09-13 NOTE — Assessment & Plan Note (Signed)
Has been seeing Dr Feliberto Gottron.  Apparently has been released.  Persistent discharge.  Caretaker states "nothing more can be done".

## 2012-09-13 NOTE — Assessment & Plan Note (Signed)
Has a documented history of anemia.  Obtain records for review.

## 2012-09-13 NOTE — Assessment & Plan Note (Signed)
Treat current infection and exacerbation.  Encourage increased po intake. Nutritional supplements/shakes.

## 2012-09-13 NOTE — Assessment & Plan Note (Signed)
S/p pacemaker.  Followed by cardiology.   

## 2012-09-13 NOTE — Assessment & Plan Note (Signed)
Apparently has paroxysmal atrial fib.  Sees cardiology.  Appears to be in SR today.  Follow.   

## 2012-09-13 NOTE — Assessment & Plan Note (Signed)
Apparently has chronic pyuria.  Currently without urinary symptoms.

## 2012-09-15 ENCOUNTER — Telehealth: Payer: Self-pay | Admitting: Internal Medicine

## 2012-09-15 NOTE — Telephone Encounter (Signed)
If needs to be seen next Friday (3/14) then I will have to see her during lunch.  Have them be here at 11:45.  Need to confirm and document patient doing ok - for hospital follow up.

## 2012-09-15 NOTE — Telephone Encounter (Signed)
Need to call and see how pt doing.

## 2012-09-15 NOTE — Telephone Encounter (Signed)
Pt was taking to [facility] and they want her to see you next Friday and they discharged her yesterday. Mon. Wed. And Friday are the only day she can come so her sons could bring her. Please call [phone]

## 2012-09-15 NOTE — Telephone Encounter (Signed)
Spoke to patient's son who said that patient is feeling better. Son stated that patient seems to be doing well.

## 2012-09-15 NOTE — Telephone Encounter (Signed)
Left message for patient to return call.

## 2012-09-15 NOTE — Telephone Encounter (Signed)
Patient is scheduled for Monday March 10th at 1:30.  The sitter couldn't come in next Friday due to her own appointment.

## 2012-09-21 ENCOUNTER — Encounter: Payer: Self-pay | Admitting: Internal Medicine

## 2012-09-21 ENCOUNTER — Ambulatory Visit (INDEPENDENT_AMBULATORY_CARE_PROVIDER_SITE_OTHER): Payer: Medicare Other | Admitting: Internal Medicine

## 2012-09-21 VITALS — BP 120/70 | HR 93 | Temp 97.8°F | Ht 62.0 in | Wt 111.5 lb

## 2012-09-21 DIAGNOSIS — J449 Chronic obstructive pulmonary disease, unspecified: Secondary | ICD-10-CM

## 2012-09-21 DIAGNOSIS — R0902 Hypoxemia: Secondary | ICD-10-CM

## 2012-09-21 DIAGNOSIS — D72829 Elevated white blood cell count, unspecified: Secondary | ICD-10-CM

## 2012-09-21 LAB — CBC WITH DIFFERENTIAL/PLATELET
Basophils Relative: 0.2 % (ref 0.0–3.0)
Eosinophils Relative: 3.1 % (ref 0.0–5.0)
HCT: 39.1 % (ref 36.0–46.0)
Hemoglobin: 12.8 g/dL (ref 12.0–15.0)
Lymphs Abs: 5.1 10*3/uL — ABNORMAL HIGH (ref 0.7–4.0)
MCV: 83 fl (ref 78.0–100.0)
Monocytes Absolute: 1.3 10*3/uL — ABNORMAL HIGH (ref 0.1–1.0)
Monocytes Relative: 8.1 % (ref 3.0–12.0)
Neutro Abs: 8.8 10*3/uL — ABNORMAL HIGH (ref 1.4–7.7)
Platelets: 388 10*3/uL (ref 150.0–400.0)
RBC: 4.71 Mil/uL (ref 3.87–5.11)
WBC: 15.6 10*3/uL — ABNORMAL HIGH (ref 4.5–10.5)

## 2012-09-21 NOTE — Patient Instructions (Signed)
Start align - one per day.  Let me know if the diarrhea does not resolve.

## 2012-09-22 LAB — BASIC METABOLIC PANEL
Chloride: 100 mEq/L (ref 96–112)
GFR: 58.93 mL/min — ABNORMAL LOW (ref >60.00)
Potassium: 4.8 mEq/L (ref 3.5–5.1)
Sodium: 136 mEq/L (ref 135–145)

## 2012-09-22 NOTE — Assessment & Plan Note (Signed)
Apparently has paroxysmal atrial fib.  Sees cardiology.  Appears to be in SR today.  Follow.   

## 2012-09-22 NOTE — Progress Notes (Signed)
Subjective:    Patient ID: Kendra Sutton, female    DOB: 1922-01-03, 79 y.o.   MRN: [id number]  HPI 79 year old female with past history of COPD on oxygen while she sleeps, peptic ulcer disease, hyperlipidemia, hypothyroidism, diabetes, sick sinus syndrome s/p pacemaker placement, depression and dementia.  She has also been followed by Dr Feliberto Gottron for an endometrial mass.  She comes in today for a hospital follow up.  She is accompanied by her caretaker.  History obtained from both of them.  She was recently admitted with shortness of breath due to acute COPD exacerbation and bronchitis.  She was given nebulizer treatments and abx.  She was also placed on prednisone.  Unclear how she has been taking the meds since her discharge.  The caretaker has been tapering the prednisone.  She states she feels better.  Breathing better.  The caretaker reports breathing has improved - noted with ambulation.  Has home O2.  She is having diarrhea.  No abdominal pain.  Is eating some and drinking boost now.      Past Medical History  Diagnosis Date  . COPD (chronic obstructive pulmonary disease)   . Pyuria     chronic  . Peptic ulcer     H. pylori per biopsy 7/03 - treated  . Hyperlipidemia   . Hypothyroidism   . Diabetes     refuses treatment  . Sick sinus syndrome     s/p pacemaker - Dr Darrold Junker  . Depression   . Dementia   . FTT (failure to thrive) in adult   . Atrial fibrillation   . Anemia     Outpatient Encounter Prescriptions as of 09/21/2012  Medication Sig Dispense Refill  . albuterol (PROVENTIL) (2.5 MG/3ML) 0.083% nebulizer solution Take 2.5 mg by nebulization every 6 (six) hours as needed for wheezing.      Marland Kitchen aspirin 81 MG tablet Take 81 mg by mouth daily.      Marland Kitchen CEFUROXIME AXETIL PO Take 500 mg by mouth 2 (two) times daily.      Marland Kitchen dextromethorphan 15 MG/5ML syrup Take 10 mLs by mouth 4 (four) times daily as needed for cough.      . Fluticasone-Salmeterol (ADVAIR) 100-50 MCG/DOSE AEPB  Inhale 1 puff into the lungs every 12 (twelve) hours.      Marland Kitchen levothyroxine (SYNTHROID, LEVOTHROID) 75 MCG tablet Take 75 mcg by mouth daily.      . metoprolol (LOPRESSOR) 50 MG tablet Take 50 mg by mouth 2 (two) times daily.      . Multiple Vitamin (MULTIVITAMIN) tablet Take 1 tablet by mouth daily.      Marland Kitchen senna (SENOKOT) 176 MG/5ML SYRP One to Two tablets by mouth twice a day      . traZODone (DESYREL) 50 MG tablet Take 50 mg by mouth at bedtime as needed.        No facility-administered encounter medications on file as of 09/21/2012.    Review of Systems Patient denies any headache, lightheadedness or dizziness.  Breathing improved.  No increased cough.  Congestion improved.  No nausea or vomiting.  No no abdominal pain.  No chest pain or tightness.  Diarrhea.  No fever.  Able  To ambulate without increased sob.       Objective:   Physical Exam  Filed Vitals:   09/21/12 1333  BP: 120/70  Pulse: 93  Temp: 97.8 F (6.59 C)   79 year old female in no acute distress.   HEENT:  Nares- clear.  Oropharynx - without lesions. NECK:  Supple.  Nontender.  No audible bruit.  HEART:  Appears to be regular. LUNGS:  Increased air movement.  No increased congestion.  Lungs clear.  No wheezing.    RADIAL PULSE:  Equal bilaterally.  ABDOMEN:  Soft, nontender.  Bowel sounds present and normal.  No audible abdominal bruit.   EXTREMITIES:  No increased edema present.      SKIN:  No lesions or rash.       Assessment & Plan:  CARDIOVASCULAR.  Stable.  Currently asymptomatic.     DIARRHEA.  Has been on abx.  No abdominal pain.  Add align.  Follow.  If persistent, will check stool for C. Diff.   Caretaker also reports she has had mild and has some issues with milk.  Follow closely.  Notify me if diarrhea persist.    LEUKOCYTOSIS.  Recheck cbc to confirm normal.   ELECTROLYTE ABNORMALITY.  Recheck metabolic panel.

## 2012-09-22 NOTE — Assessment & Plan Note (Signed)
Refuses treatment.   Does not check sugars.  Obtain outside records.    

## 2012-09-22 NOTE — Assessment & Plan Note (Signed)
Has known COPD.  Recently admitted with COPD exacerbation.  Treated with abx, prednisone and nebs.  Continue inhalers.  Complete prednisone taper.  Follow.  Has home O2.  Breathing improved.

## 2012-09-22 NOTE — Assessment & Plan Note (Signed)
Has home O2.  Follow.

## 2012-09-23 ENCOUNTER — Ambulatory Visit (INDEPENDENT_AMBULATORY_CARE_PROVIDER_SITE_OTHER)
Admission: RE | Admit: 2012-09-23 | Discharge: 2012-09-23 | Disposition: A | Discharge status: Home / Self Care | Payer: Medicare Other | Source: Ambulatory Visit | Attending: Internal Medicine | Admitting: Internal Medicine

## 2012-09-29 ENCOUNTER — Telehealth: Payer: Self-pay | Admitting: Internal Medicine

## 2012-09-29 ENCOUNTER — Telehealth: Payer: Self-pay | Admitting: *Deleted

## 2012-09-29 NOTE — Telephone Encounter (Signed)
Patient Information:  Caller Name: Lupita Leash  Phone: [phone]  Patient: Kendra Sutton  Gender: Female  DOB: February 12, 1922  Age: 79 Years  PCP: Dale [place]  Office Follow Up:  Does the office need to follow up with this patient?: No  Instructions For The Office: N/A   Symptoms  Reason For Call & Symptoms: Caregiver states pt has vaginal discharge and redness.  Reviewed Health History In EMR: Yes  Reviewed Medications In EMR: Yes  Reviewed Allergies In EMR: Yes  Reviewed Surgeries / Procedures: Yes  Date of Onset of Symptoms: 09/29/2012  Treatments Tried: Nystatin cream  Treatments Tried Worked: No  Guideline(s) Used:  Vaginal Discharge  Disposition Per Guideline:   See Within 3 Days in Office  Reason For Disposition Reached:   Bad smelling vaginal discharge  Advice Given:  Antifungal Medication for Vaginal Yeast Infection:   Do not use yeast medication during the 24 hours prior to a physician appointment (Reason: interferes with examination).  Genital Hygiene:   Keep your genital area clean. Wash daily.  Keep your genital area dry. Wear cotton underwear or underwear with a cotton crotch.  Call Back If:  There is no improvement after treating yourself for a vaginal yeast infection  You become worse.  Patient Will Follow Care Advice:  YES  Appointment Scheduled:  10/01/2012 11:00:00 Appointment Scheduled Provider:  Dale [place]

## 2012-09-29 NOTE — Telephone Encounter (Signed)
Called patient back for info on what was going on. Patient stated that she already has appointment on 10/01/12.

## 2012-09-29 NOTE — Telephone Encounter (Signed)
Pt is having a discharge with a strong odor. Lupita Leash thinks its a yeast infection. Pt is over wiping. Please call before 4pm.

## 2012-09-29 NOTE — Telephone Encounter (Signed)
Just an FYI

## 2012-10-01 ENCOUNTER — Encounter: Payer: Self-pay | Admitting: Internal Medicine

## 2012-10-01 ENCOUNTER — Ambulatory Visit (INDEPENDENT_AMBULATORY_CARE_PROVIDER_SITE_OTHER): Payer: Medicare Other | Admitting: Internal Medicine

## 2012-10-01 VITALS — BP 120/74 | HR 69 | Temp 98.7°F | Ht 62.0 in | Wt 113.0 lb

## 2012-10-04 ENCOUNTER — Encounter: Payer: Self-pay | Admitting: Internal Medicine

## 2012-10-04 NOTE — Progress Notes (Signed)
Subjective:    Patient ID: Tyffany Waldrop, female    DOB: Jul 15, 1922, 79 y.o.   MRN: [id number]  HPI 79 year old female with past history of COPD on oxygen while she sleeps, peptic ulcer disease, hyperlipidemia, hypothyroidism, diabetes, sick sinus syndrome s/p pacemaker placement, depression and dementia.  She has also been followed by Dr Feliberto Gottron for an endometrial mass.  She comes in today as a work in with concerns regarding vaginal discharge.  She states she has noticed recently some vaginal discharge.  No abdominal pain or cramping.  No urine change.  No itching or vaginal discomfort.  No fever.  Feels her breathing is stable.      Past Medical History  Diagnosis Date  . COPD (chronic obstructive pulmonary disease)   . Pyuria     chronic  . Peptic ulcer     H. pylori per biopsy 7/03 - treated  . Hyperlipidemia   . Hypothyroidism   . Diabetes     refuses treatment  . Sick sinus syndrome     s/p pacemaker - Dr Darrold Junker  . Depression   . Dementia   . FTT (failure to thrive) in adult   . Atrial fibrillation   . Anemia     Outpatient Encounter Prescriptions as of 10/01/2012  Medication Sig Dispense Refill  . albuterol (PROVENTIL) (2.5 MG/3ML) 0.083% nebulizer solution Take 2.5 mg by nebulization every 6 (six) hours as needed for wheezing.      Marland Kitchen aspirin 81 MG tablet Take 81 mg by mouth daily.      Marland Kitchen CEFUROXIME AXETIL PO Take 500 mg by mouth 2 (two) times daily.      Marland Kitchen dextromethorphan 15 MG/5ML syrup Take 10 mLs by mouth 4 (four) times daily as needed for cough.      . Fluticasone-Salmeterol (ADVAIR) 100-50 MCG/DOSE AEPB Inhale 1 puff into the lungs every 12 (twelve) hours.      Marland Kitchen levothyroxine (SYNTHROID, LEVOTHROID) 75 MCG tablet Take 75 mcg by mouth daily.      . metoprolol (LOPRESSOR) 50 MG tablet Take 50 mg by mouth 2 (two) times daily.      . Multiple Vitamin (MULTIVITAMIN) tablet Take 1 tablet by mouth daily.      Marland Kitchen senna (SENOKOT) 176 MG/5ML SYRP One to Two tablets by  mouth twice a day      . traZODone (DESYREL) 50 MG tablet Take 50 mg by mouth at bedtime as needed.        No facility-administered encounter medications on file as of 10/01/2012.    Review of Systems Patient denies any headache, lightheadedness or dizziness.  Breathing improved.  No increased cough.  Congestion improved.  No nausea or vomiting.  No no abdominal pain.  Diarrhea improved.  Vaginal discharge as outlined.       Objective:   Physical Exam  Filed Vitals:   10/01/12 1111  BP: 120/74  Pulse: 69  Temp: 98.7 F (48.69 C)   79 year old female in no acute distress.  NECK:  Supple.  Nontender.  No audible bruit.  HEART:  Appears to be regular. LUNGS:  Increased air movement.  No increased congestion.  Lungs clear.  No wheezing.    RADIAL PULSE:  Equal bilaterally.  ABDOMEN:  Soft, nontender.  Bowel sounds present and normal.  No audible abdominal bruit.  GU:  Normal external genitalia except some minimal erythema.  Vaginal vault with discharge present.  KOH/wet prep sent.  Could not appreciate any adnexal  masses or tenderness.   EXTREMITIES:  No increased edema present.      SKIN:  No lesions or rash.       Assessment & Plan:  VAGINITIS.  KOH/wet prep sent.  Await results.   CARDIOVASCULAR.  Stable.  Currently asymptomatic.     DIARRHEA. Resolved.

## 2012-10-04 NOTE — Assessment & Plan Note (Signed)
Has known COPD.  Recently admitted with COPD exacerbation.  Treated with abx, prednisone and nebs.  Continue inhalers.  Breathing improved.

## 2012-10-06 ENCOUNTER — Telehealth: Payer: Self-pay | Admitting: Internal Medicine

## 2012-10-06 NOTE — Telephone Encounter (Signed)
Waiting for results.

## 2012-10-06 NOTE — Telephone Encounter (Signed)
The wet prep for this pt is showing - needs to be collected. We collected this when the pt was here last week.  Need results.  Thanks.

## 2012-10-06 NOTE — Telephone Encounter (Signed)
Care giver calling for results for Kendra Sutton.

## 2012-10-12 NOTE — Telephone Encounter (Addendum)
Kendra Sutton is faxing results to office.

## 2012-10-13 DIAGNOSIS — I4891 Unspecified atrial fibrillation: Secondary | ICD-10-CM

## 2012-10-13 DIAGNOSIS — R918 Other nonspecific abnormal finding of lung field: Secondary | ICD-10-CM

## 2012-10-13 DIAGNOSIS — E119 Type 2 diabetes mellitus without complications: Secondary | ICD-10-CM

## 2012-10-13 DIAGNOSIS — J4489 Other specified chronic obstructive pulmonary disease: Secondary | ICD-10-CM

## 2012-10-13 DIAGNOSIS — E876 Hypokalemia: Secondary | ICD-10-CM

## 2012-10-13 DIAGNOSIS — R0902 Hypoxemia: Secondary | ICD-10-CM

## 2012-10-13 DIAGNOSIS — J449 Chronic obstructive pulmonary disease, unspecified: Secondary | ICD-10-CM

## 2012-10-19 NOTE — Telephone Encounter (Signed)
Results received

## 2012-11-23 ENCOUNTER — Encounter: Payer: Self-pay | Admitting: Internal Medicine

## 2012-11-23 ENCOUNTER — Ambulatory Visit (INDEPENDENT_AMBULATORY_CARE_PROVIDER_SITE_OTHER): Payer: Medicare Other | Admitting: Internal Medicine

## 2012-11-23 VITALS — BP 104/80 | HR 60 | Temp 98.5°F | Ht 62.0 in | Wt 111.8 lb

## 2012-11-23 DIAGNOSIS — I4891 Unspecified atrial fibrillation: Secondary | ICD-10-CM

## 2012-11-23 DIAGNOSIS — K279 Peptic ulcer, site unspecified, unspecified as acute or chronic, without hemorrhage or perforation: Secondary | ICD-10-CM

## 2012-11-23 DIAGNOSIS — R918 Other nonspecific abnormal finding of lung field: Secondary | ICD-10-CM

## 2012-11-23 DIAGNOSIS — E039 Hypothyroidism, unspecified: Secondary | ICD-10-CM

## 2012-11-23 DIAGNOSIS — J449 Chronic obstructive pulmonary disease, unspecified: Secondary | ICD-10-CM

## 2012-11-23 DIAGNOSIS — R9389 Abnormal findings on diagnostic imaging of other specified body structures: Secondary | ICD-10-CM | POA: Insufficient documentation

## 2012-11-23 DIAGNOSIS — E119 Type 2 diabetes mellitus without complications: Secondary | ICD-10-CM

## 2012-11-23 DIAGNOSIS — N9489 Other specified conditions associated with female genital organs and menstrual cycle: Secondary | ICD-10-CM

## 2012-11-23 DIAGNOSIS — R0602 Shortness of breath: Secondary | ICD-10-CM

## 2012-11-23 MED ORDER — LEVOFLOXACIN 250 MG PO TABS: 250.0000 mg | ORAL_TABLET | Freq: Every day | ORAL | Status: DC

## 2012-11-23 MED ORDER — ALBUTEROL SULFATE (2.5 MG/3ML) 0.083% IN NEBU
2.5000 mg | INHALATION_SOLUTION | Freq: Once | RESPIRATORY_TRACT | Status: AC
  Administered 2012-11-23: 2.5 mg via RESPIRATORY_TRACT

## 2012-11-23 MED ORDER — PREDNISONE 10 MG PO TABS: ORAL_TABLET | ORAL | Status: DC

## 2012-11-23 NOTE — Assessment & Plan Note (Signed)
Have discussed with her [name]. Wanted to hold on scanning.  Discussed with pt today.  She plans to discuss with her son.  Follow.  David to call back to discuss.

## 2012-11-23 NOTE — Assessment & Plan Note (Signed)
Refuses treatment.   Does not check sugars.  Obtain outside records.

## 2012-11-23 NOTE — Assessment & Plan Note (Signed)
Has known COPD.  Recently admitted with COPD exacerbation.  Treated with abx, prednisone and nebs.  Continue inhalers.  Now with increased cough and congestion.  Will treat with Levaquin 250mg  q day.  Prednisone taper starting at 40mg  and decreasing by 5mg  each day until off.  Use the inhalers regularly.  Oxygen as directed.  Robitussin as directed.  Get her back in soon to reassess.  If any worsening change or problems, she is to be evaluated.

## 2012-11-23 NOTE — Assessment & Plan Note (Signed)
Has been seeing Dr Schermerhorn.  Apparently has been released.  Caretaker states "nothing more can be done".    

## 2012-11-23 NOTE — Assessment & Plan Note (Signed)
Previous positive H. Pylori.  Treated. Currently asymptomatic.   

## 2012-11-23 NOTE — Progress Notes (Signed)
Subjective:    Patient ID: Kendra Sutton, female    DOB: 1922-06-03, 79 y.o.   MRN: [id number]  HPI 79 year old female with past history of COPD on oxygen while she sleeps, peptic ulcer disease, hyperlipidemia, hypothyroidism, diabetes, sick sinus syndrome s/p pacemaker placement, depression and dementia.  She has also been followed by Dr Feliberto Gottron for an endometrial mass.  She comes in today for a scheduled follow up. She is accompanied by her caretaker.  History obtained from both of them.  She was recently admitted with shortness of breath due to acute COPD exacerbation and bronchitis.  She was given nebulizer treatments and abx.  She was also placed on prednisone.  Has home O2.  Not using regularly (per caretaker).  She is now with increased cough and congestion.  Some wheezing.  Some decreased appetite.   Is eating.  No nausea or vomiting.  Now bowel change.       Past Medical History  Diagnosis Date  . COPD (chronic obstructive pulmonary disease)   . Pyuria     chronic  . Peptic ulcer     H. pylori per biopsy 7/03 - treated  . Hyperlipidemia   . Hypothyroidism   . Diabetes     refuses treatment  . Sick sinus syndrome     s/p pacemaker - Dr Darrold Junker  . Depression   . Dementia   . FTT (failure to thrive) in adult   . Atrial fibrillation   . Anemia     Outpatient Encounter Prescriptions as of 11/23/2012  Medication Sig Dispense Refill  . albuterol (PROVENTIL) (2.5 MG/3ML) 0.083% nebulizer solution Take 2.5 mg by nebulization every 6 (six) hours as needed for wheezing.      Marland Kitchen aspirin 81 MG tablet Take 81 mg by mouth daily.      . Fluticasone-Salmeterol (ADVAIR) 100-50 MCG/DOSE AEPB Inhale 1 puff into the lungs every 12 (twelve) hours.      Marland Kitchen levothyroxine (SYNTHROID, LEVOTHROID) 75 MCG tablet Take 75 mcg by mouth daily.      . metoprolol (LOPRESSOR) 50 MG tablet Take 50 mg by mouth 2 (two) times daily.      . Multiple Vitamin (MULTIVITAMIN) tablet Take 1 tablet by mouth daily.       Marland Kitchen senna (SENOKOT) 176 MG/5ML SYRP One to Two tablets by mouth twice a day      . traZODone (DESYREL) 50 MG tablet Take 50 mg by mouth at bedtime as needed.       Marland Kitchen dextromethorphan 15 MG/5ML syrup Take 10 mLs by mouth 4 (four) times daily as needed for cough.      Marland Kitchen levofloxacin (LEVAQUIN) 250 MG tablet Take 1 tablet (250 mg total) by mouth daily.  10 tablet  0  . predniSONE (DELTASONE) 10 MG tablet Take 4 tablets x 1 day and then decrease by 1/2 tablet per day until down to zero mg.  18 tablet  0  . [DISCONTINUED] CEFUROXIME AXETIL PO Take 500 mg by mouth 2 (two) times daily.      . [EXPIRED] albuterol (PROVENTIL) (2.5 MG/3ML) 0.083% nebulizer solution 2.5 mg        No facility-administered encounter medications on file as of 11/23/2012.    Review of Systems Patient denies any headache, lightheadedness or dizziness.  Increased cough and congestion as outlined.  No acid reflux.  No nausea or vomiting.  No no abdominal pain.  No chest pain or tightness.  No diarrhea.  No fever.  Objective:   Physical Exam  Filed Vitals:   11/23/12 1501  BP:   Pulse: 60  Temp:    79 year old female in no acute distress.   HEENT:  Nares- clear.  Oropharynx - without lesions. NECK:  Supple.  Nontender.  No audible bruit.  HEART:  Appears to be regular. LUNGS:  Increased congestion.  Cleared some with coughing.  Some minimal wheezing.      RADIAL PULSE:  Equal bilaterally.  ABDOMEN:  Soft, nontender.  Bowel sounds present and normal.    EXTREMITIES:  No increased edema present.      SKIN:  No lesions or rash.       Assessment & Plan:  CARDIOVASCULAR.  Stable.  Currently asymptomatic.

## 2012-11-23 NOTE — Patient Instructions (Signed)
Prednisone taper - 4 tablets x 1 day and then decrease by 1/2 tablet per day until off.  Use the inhalers as instructed.  levaquin 250mg  - one per day.  Robitussin DM as directed.

## 2012-11-23 NOTE — Assessment & Plan Note (Signed)
Apparently has paroxysmal atrial fib.  Sees cardiology.  Appears to be in SR today.  Follow.   

## 2012-11-23 NOTE — Assessment & Plan Note (Signed)
On thyroid replacement.  Follow tsh.  

## 2012-11-24 ENCOUNTER — Telehealth: Payer: Self-pay | Admitting: *Deleted

## 2012-11-24 DIAGNOSIS — R9389 Abnormal findings on diagnostic imaging of other specified body structures: Secondary | ICD-10-CM

## 2012-11-24 MED ORDER — ALBUTEROL SULFATE HFA 108 (90 BASE) MCG/ACT IN AERS: 2.0000 | INHALATION_SPRAY | Freq: Four times a day (QID) | RESPIRATORY_TRACT | Status: DC | PRN

## 2012-11-24 NOTE — Telephone Encounter (Signed)
Pt's [name]) stated that he was told to call Dr. Lorin Picket today. Pt was seen in office yesterday

## 2012-11-24 NOTE — Telephone Encounter (Signed)
Called pts [name].  He is now agreeable to pursue chest CT to follow up regarding abnormal cxr.  Will schedule for next week.

## 2012-11-30 ENCOUNTER — Ambulatory Visit: Payer: Self-pay | Admitting: Internal Medicine

## 2012-12-05 ENCOUNTER — Telehealth: Payer: Self-pay | Admitting: Internal Medicine

## 2012-12-05 DIAGNOSIS — R9389 Abnormal findings on diagnostic imaging of other specified body structures: Secondary | ICD-10-CM

## 2012-12-05 NOTE — Telephone Encounter (Signed)
Pts [name] notified of CT chest results and need for esophogram/swallowing evaluation, sputum for MAI and pulmonary consult.  He is in agreement.  Order placed.

## 2012-12-09 ENCOUNTER — Telehealth: Payer: Self-pay | Admitting: *Deleted

## 2012-12-09 ENCOUNTER — Other Ambulatory Visit: Payer: Self-pay | Admitting: *Deleted

## 2012-12-09 DIAGNOSIS — A310 Pulmonary mycobacterial infection: Secondary | ICD-10-CM

## 2012-12-09 NOTE — Telephone Encounter (Signed)
MAI Sputum x3 are ordered

## 2012-12-09 NOTE — Telephone Encounter (Signed)
Thank you :)

## 2012-12-11 ENCOUNTER — Encounter: Payer: Self-pay | Admitting: Emergency Medicine

## 2012-12-14 ENCOUNTER — Ambulatory Visit (INDEPENDENT_AMBULATORY_CARE_PROVIDER_SITE_OTHER): Payer: Medicare Other | Admitting: Internal Medicine

## 2012-12-14 ENCOUNTER — Telehealth: Payer: Self-pay | Admitting: Internal Medicine

## 2012-12-14 NOTE — Telephone Encounter (Signed)
Pt came in an hour early for her appointment.  I let her caregiver that checked her in know that they were an hour early she stated she knew and that they would wait.  At 11:09 the caregiver came up and stated they were leaving. The gentleman with the stated they had waited long enough.  I explain again that there appointment was 11 the gentleman stated it was after 11 and they were leaving.  Dr Lorin Picket stated they were getting ready to call her back to be seen.  I went out to the car in parking lot to let them know they were getting ready to be called back.  The gentleman stated they would call back to reschedule appointment.

## 2012-12-14 NOTE — Telephone Encounter (Signed)
Care giver called back to r/s appointment  To 12/18/12 @ 10:30  30 min appointment

## 2012-12-18 ENCOUNTER — Ambulatory Visit (INDEPENDENT_AMBULATORY_CARE_PROVIDER_SITE_OTHER): Payer: Medicare Other | Admitting: Internal Medicine

## 2012-12-18 ENCOUNTER — Encounter: Payer: Self-pay | Admitting: Internal Medicine

## 2012-12-18 VITALS — BP 130/90 | HR 70 | Temp 98.5°F | Ht 62.0 in | Wt 121.5 lb

## 2012-12-18 DIAGNOSIS — I4891 Unspecified atrial fibrillation: Secondary | ICD-10-CM

## 2012-12-18 DIAGNOSIS — J449 Chronic obstructive pulmonary disease, unspecified: Secondary | ICD-10-CM

## 2012-12-18 DIAGNOSIS — R9389 Abnormal findings on diagnostic imaging of other specified body structures: Secondary | ICD-10-CM

## 2012-12-18 DIAGNOSIS — R918 Other nonspecific abnormal finding of lung field: Secondary | ICD-10-CM

## 2012-12-18 DIAGNOSIS — N9489 Other specified conditions associated with female genital organs and menstrual cycle: Secondary | ICD-10-CM

## 2012-12-18 DIAGNOSIS — D649 Anemia, unspecified: Secondary | ICD-10-CM

## 2012-12-18 DIAGNOSIS — K279 Peptic ulcer, site unspecified, unspecified as acute or chronic, without hemorrhage or perforation: Secondary | ICD-10-CM

## 2012-12-18 DIAGNOSIS — E785 Hyperlipidemia, unspecified: Secondary | ICD-10-CM

## 2012-12-18 DIAGNOSIS — R0902 Hypoxemia: Secondary | ICD-10-CM

## 2012-12-18 DIAGNOSIS — E119 Type 2 diabetes mellitus without complications: Secondary | ICD-10-CM

## 2012-12-20 ENCOUNTER — Encounter: Payer: Self-pay | Admitting: Internal Medicine

## 2012-12-20 NOTE — Assessment & Plan Note (Addendum)
Has a documented history of anemia.  Hgb 09/21/12 normal.

## 2012-12-20 NOTE — Assessment & Plan Note (Signed)
Refuses treatment.   Does not check sugars.  Obtain outside records.    

## 2012-12-20 NOTE — Assessment & Plan Note (Signed)
Refuses treatment.  Follow.   

## 2012-12-20 NOTE — Assessment & Plan Note (Signed)
Has been seeing Dr Schermerhorn.  Apparently has been released.  Caretaker states "nothing more can be done".    

## 2012-12-20 NOTE — Progress Notes (Signed)
Subjective:    Patient ID: Kendra Sutton, female    DOB: October 24, 1921, 79 y.o.   MRN: [id number]  HPI 79 year old female with past history of COPD on oxygen while she sleeps, peptic ulcer disease, hyperlipidemia, hypothyroidism, diabetes, sick sinus syndrome s/p pacemaker placement, depression and dementia.  She has also been followed by Dr Feliberto Gottron for an endometrial mass.  She comes in today for a scheduled follow up.  She was recently admitted with shortness of breath due to acute COPD exacerbation and bronchitis.  She was given nebulizer treatments and abx.  She was also placed on prednisone.  Has home O2.  Unclear how compliant she is at home with her oxygen and her inhalers.  Have tried to talk with both of her sons as well as her caretaker.  Still unclear what she is doing at home.  Recently had a chest CT to f/u abnormal cxr.  See CT report for details.  Concern over possible aspiration vs MAI, etc.  Discussed this with her today and discussed with her son [name] (who brought her today).  I had previously discussed results with Onalee Hua (POA).  They were in agreement for further w/up.  She is scheduled for a modified barium swallow.  Also have ordered sputum for MAI.  She is planning to f/u with Dr Kendrick Fries later this month.  Breathing appears to be relatively stable.  Some cough and congestion at her baseline.  No increased cough or congestion today.  States she is eating and drinking.  Some decreased appetite and is a "picky eater".  Weight is up.  She was reweighed today.  Unclear if the previous weight is correct.     Past Medical History  Diagnosis Date  . COPD (chronic obstructive pulmonary disease)   . Pyuria     chronic  . Peptic ulcer     H. pylori per biopsy 7/03 - treated  . Hyperlipidemia   . Hypothyroidism   . Diabetes     refuses treatment  . Sick sinus syndrome     s/p pacemaker - Dr Darrold Junker  . Depression   . Dementia   . FTT (failure to thrive) in adult   . Atrial  fibrillation   . Anemia     Outpatient Encounter Prescriptions as of 12/18/2012  Medication Sig Dispense Refill  . albuterol (PROVENTIL HFA;VENTOLIN HFA) 108 (90 BASE) MCG/ACT inhaler Inhale 2 puffs into the lungs every 6 (six) hours as needed for wheezing.  1 Inhaler  2  . aspirin 81 MG tablet Take 81 mg by mouth daily.      . Fluticasone-Salmeterol (ADVAIR) 100-50 MCG/DOSE AEPB Inhale 1 puff into the lungs every 12 (twelve) hours.      Marland Kitchen levothyroxine (SYNTHROID, LEVOTHROID) 75 MCG tablet Take 75 mcg by mouth daily.      . metoprolol (LOPRESSOR) 50 MG tablet Take 50 mg by mouth 2 (two) times daily.      . Multiple Vitamin (MULTIVITAMIN) tablet Take 1 tablet by mouth daily.      Marland Kitchen senna (SENOKOT) 176 MG/5ML SYRP One to Two tablets by mouth twice a day      . traZODone (DESYREL) 50 MG tablet Take 50 mg by mouth at bedtime as needed.       Marland Kitchen dextromethorphan 15 MG/5ML syrup Take 10 mLs by mouth 4 (four) times daily as needed for cough.      . [DISCONTINUED] levofloxacin (LEVAQUIN) 250 MG tablet Take 1 tablet (250 mg total) by  mouth daily.  10 tablet  0  . [DISCONTINUED] predniSONE (DELTASONE) 10 MG tablet Take 4 tablets x 1 day and then decrease by 1/2 tablet per day until down to zero mg.  18 tablet  0   No facility-administered encounter medications on file as of 12/18/2012.    Review of Systems Patient denies any headache, lightheadedness or dizziness.  No increased cough and congestion.    No acid reflux.  No nausea or vomiting.  No no abdominal pain.  No chest pain or tightness.  No diarrhea.  No fever.   Eating.  Lives with her two sons.       Objective:   Physical Exam  Filed Vitals:   12/18/12 1034  BP: 130/90  Pulse: 70  Temp: 98.5 F (9.85 C)   79 year old female in no acute distress.   HEENT:  Nares- clear.  Oropharynx - without lesions. NECK:  Supple.  Nontender.  No audible bruit.  HEART:  Appears to be regular. LUNGS:  Increased air movement.  No increased cough today.   No increased wheezing.       RADIAL PULSE:  Equal bilaterally.  ABDOMEN:  Soft, nontender.  Bowel sounds present and normal.    EXTREMITIES:  No increased edema present.      SKIN:  No lesions or rash.       Assessment & Plan:  CARDIOVASCULAR.  Stable.  Currently asymptomatic.

## 2012-12-20 NOTE — Assessment & Plan Note (Signed)
Previous positive H. Pylori.  Treated. Currently asymptomatic.   

## 2012-12-20 NOTE — Assessment & Plan Note (Signed)
Have discussed with her [name] and have now discussed with her and her other son [name].  Discussed results of cxr and chest CT.  Scheduled for a modified barium swallow.  Obtain sputum for MAI x 3.  Keep appt with Dr Kendrick Fries.

## 2012-12-20 NOTE — Assessment & Plan Note (Signed)
Has home O2.  Follow.  Encouraged her and her family to use.   

## 2012-12-20 NOTE — Assessment & Plan Note (Signed)
Has known COPD.  Recently admitted with COPD exacerbation.  Treated with abx, prednisone and nebs.  Breathing stable today.  Encourage her to use her inhalers.  Planning for f/u with Dr Kendrick Fries as outlined.

## 2012-12-20 NOTE — Assessment & Plan Note (Signed)
Apparently has paroxysmal atrial fib.  Sees cardiology.  Appears to be in SR today.  Follow.   

## 2012-12-20 NOTE — Assessment & Plan Note (Signed)
Refuses treatment

## 2012-12-29 ENCOUNTER — Institutional Professional Consult (permissible substitution): Payer: Medicare Other | Admitting: Pulmonary Disease

## 2012-12-30 ENCOUNTER — Encounter: Payer: Self-pay | Admitting: Internal Medicine

## 2012-12-30 ENCOUNTER — Ambulatory Visit (INDEPENDENT_AMBULATORY_CARE_PROVIDER_SITE_OTHER): Payer: Medicare Other | Admitting: Internal Medicine

## 2012-12-30 VITALS — BP 102/60 | HR 65 | Temp 98.6°F | Ht 62.0 in | Wt 111.0 lb

## 2012-12-30 DIAGNOSIS — R0902 Hypoxemia: Secondary | ICD-10-CM

## 2012-12-30 DIAGNOSIS — J449 Chronic obstructive pulmonary disease, unspecified: Secondary | ICD-10-CM

## 2012-12-30 DIAGNOSIS — R918 Other nonspecific abnormal finding of lung field: Secondary | ICD-10-CM

## 2012-12-30 NOTE — Patient Instructions (Addendum)
Continue 02 2lpm 24/7  No further pulmonary follow up is needed  I strongly recommend reconsideration for hospice as it appears whatever the mass is in the uterus has spread to the lungs and she is not a candidate for a lung biopsy as unlikely to survive it and unlikely to make her feel any better.

## 2012-12-30 NOTE — Progress Notes (Signed)
  Subjective:    Patient ID: Kendra Sutton, female    DOB: 1922-06-15  MRN: [id number]  HPI  16 yowf never smoker with dementia and FTT in setting of endometrial mass for which "nothing more can be done" per Caretakers so referred to hospice but sons upset with this and changed to Dr Lorin Picket who referred her to pulmonary clinic 12/30/12 for MPN by CT.  12/30/2012 1st pulmonary eval cc sob with minimal activities ? Some better with inhalers x months, indolent onset, min progressive, ? resp to advair ?  - has 02 but not wearing it.  No obvious daytime variabilty or assoc chronic cough or cp or chest tightness, subjective wheeze overt sinus or hb symptoms. No unusual exp hx or h/o childhood pna/ asthma or knowledge of premature birth.   Sleeping ok without nocturnal  or early am exacerbation  of respiratory  c/o's or need for noct saba. Also denies any obvious fluctuation of symptoms with weather or environmental changes or other aggravating or alleviating factors except as outlined above   Review of Systems  Constitutional: Negative for fever, chills and unexpected weight change.  HENT: Negative for ear pain, nosebleeds, congestion, sore throat, rhinorrhea, sneezing, trouble swallowing, dental problem, voice change, postnasal drip and sinus pressure.   Eyes: Negative for visual disturbance.  Respiratory: Positive for shortness of breath. Negative for cough, choking and stridor.   Cardiovascular: Negative for chest pain and leg swelling.  Gastrointestinal: Negative for vomiting, abdominal pain and diarrhea.  Genitourinary: Negative for difficulty urinating.  Musculoskeletal: Negative for arthralgias.  Skin: Negative for rash.  Neurological: Negative for tremors, syncope and headaches.  Hematological: Does not bruise/bleed easily.       Objective:   Physical Exam  Frail elderly wf is ambulatory but very weak appearing and can't give coherent hx Wt Readings from Last 3 Encounters:  12/30/12 111  lb (50.349 kg)  12/18/12 121 lb 8 oz (55.112 kg)  11/23/12 111 lb 12.8 oz (50.712 kg)    HEENT: nl dentition, turbinates, and orophanx. Nl external ear canals without cough reflex   NECK :  without JVD/Nodes/TM/ nl carotid upstrokes bilaterally   LUNGS: no acc muscle use, clear to A and P bilaterally without cough on insp or exp maneuvers   CV:  RRR  no s3 or murmur or increase in P2, no edema   ABD:  soft and nontender with nl excursion in the supine position. No bruits or organomegaly, bowel sounds nl  MS:  warm without deformities, calf tenderness, cyanosis or clubbing  SKIN: warm and dry without lesions    NEURO:    No focal deficits, oriented to person but not date or situation.    Ct and cxr reports available and reviewed but no actual studies     Assessment & Plan:

## 2012-12-31 ENCOUNTER — Encounter: Payer: Self-pay | Admitting: Internal Medicine

## 2012-12-31 DIAGNOSIS — R918 Other nonspecific abnormal finding of lung field: Secondary | ICD-10-CM | POA: Insufficient documentation

## 2012-12-31 NOTE — Assessment & Plan Note (Signed)
Although there are clearly abnormalities on CT scan, they should probably be considered "microscopic" since not obvious on plain cxr .     In the setting of obvious "macroscopic" health issues,  I am very reluctant  to embark on an invasive w/u at this point - she could well have met uterine ca to lung or low grade mai, but neither of these likely causing any breathing difficulty which is her chief complaint and she is not a candidate for invasive procedures to sort out the cause because the implication of a tissue dx would have no benefit to her overall care.  I agree with w/u for asp and checking sputum (if she develops a productive cough, which she denied)  Since MAI is in the ddx, rx with levaquin for acute exacerbations would be appropriate.  Pulmonary f/u can be prn > I strongly advised son to reconsider hospice should her health further decline.

## 2012-12-31 NOTE — Assessment & Plan Note (Signed)
02 sats ok on RA at ov but rec she maintain on it 24/7 since ams will make it difficult for caretakers to assess her needs

## 2012-12-31 NOTE — Assessment & Plan Note (Signed)
She has never smoked so likely has chronic asthma, not copd, but seems reasonably well controlled on advair.  Another option would be perforomist/ budesonide per neb if breathing worsens.

## 2013-01-08 ENCOUNTER — Ambulatory Visit: Payer: Self-pay | Admitting: Internal Medicine

## 2013-01-18 ENCOUNTER — Telehealth: Payer: Self-pay | Admitting: *Deleted

## 2013-01-18 NOTE — Telephone Encounter (Signed)
Notify pt's family that the swallowing study did not reveal significant aspiration. Recommend a soft mechanical diet (soft foods). Okay for thin liquids

## 2013-01-19 ENCOUNTER — Telehealth: Payer: Self-pay | Admitting: *Deleted

## 2013-01-19 NOTE — Telephone Encounter (Signed)
Caregiver returned call & informed of swallowing study results (see message from 01/18/13)

## 2013-01-27 ENCOUNTER — Encounter: Payer: Self-pay | Admitting: Internal Medicine

## 2013-02-04 ENCOUNTER — Telehealth: Payer: Self-pay | Admitting: *Deleted

## 2013-02-04 MED ORDER — DERMACLOUD EX CREA: TOPICAL_CREAM | CUTANEOUS | Status: AC

## 2013-02-04 NOTE — Telephone Encounter (Signed)
Donna aware

## 2013-02-04 NOTE — Telephone Encounter (Signed)
I sent in a rx for dermacloud.  Let me know if they feel she needs to be seen earlier or if does not help.

## 2013-02-04 NOTE — Telephone Encounter (Signed)
Pt is insisting to lay only on one side most of the time. The sitter has noticed some breaking of the skin. Hopsice had used the 454 Dermacloud tropical (Fanny cream) ointment for Bed sores in the past. Wanted to know if this is okay to use or if she should be using something else. Please advise (pt has appt with you on 02/19/13)

## 2013-02-19 ENCOUNTER — Encounter: Payer: Self-pay | Admitting: Internal Medicine

## 2013-02-19 ENCOUNTER — Ambulatory Visit (INDEPENDENT_AMBULATORY_CARE_PROVIDER_SITE_OTHER): Payer: Medicare Other | Admitting: Internal Medicine

## 2013-02-19 VITALS — BP 110/80 | HR 64 | Temp 98.8°F | Ht 62.0 in | Wt 122.0 lb

## 2013-02-19 DIAGNOSIS — R918 Other nonspecific abnormal finding of lung field: Secondary | ICD-10-CM

## 2013-02-19 DIAGNOSIS — R9389 Abnormal findings on diagnostic imaging of other specified body structures: Secondary | ICD-10-CM

## 2013-02-19 DIAGNOSIS — I4891 Unspecified atrial fibrillation: Secondary | ICD-10-CM

## 2013-02-19 DIAGNOSIS — E039 Hypothyroidism, unspecified: Secondary | ICD-10-CM

## 2013-02-19 DIAGNOSIS — R8281 Pyuria: Secondary | ICD-10-CM

## 2013-02-19 DIAGNOSIS — K279 Peptic ulcer, site unspecified, unspecified as acute or chronic, without hemorrhage or perforation: Secondary | ICD-10-CM

## 2013-02-19 DIAGNOSIS — N390 Urinary tract infection, site not specified: Secondary | ICD-10-CM

## 2013-02-19 DIAGNOSIS — E119 Type 2 diabetes mellitus without complications: Secondary | ICD-10-CM

## 2013-02-19 DIAGNOSIS — E785 Hyperlipidemia, unspecified: Secondary | ICD-10-CM

## 2013-02-19 DIAGNOSIS — N9489 Other specified conditions associated with female genital organs and menstrual cycle: Secondary | ICD-10-CM

## 2013-02-19 DIAGNOSIS — R82998 Other abnormal findings in urine: Secondary | ICD-10-CM

## 2013-02-19 DIAGNOSIS — J449 Chronic obstructive pulmonary disease, unspecified: Secondary | ICD-10-CM

## 2013-02-19 DIAGNOSIS — D649 Anemia, unspecified: Secondary | ICD-10-CM

## 2013-02-19 DIAGNOSIS — D72829 Elevated white blood cell count, unspecified: Secondary | ICD-10-CM

## 2013-02-19 DIAGNOSIS — I495 Sick sinus syndrome: Secondary | ICD-10-CM

## 2013-02-19 DIAGNOSIS — R0902 Hypoxemia: Secondary | ICD-10-CM

## 2013-02-19 LAB — CBC WITH DIFFERENTIAL/PLATELET
Basophils Absolute: 0 10*3/uL (ref 0.0–0.1)
Eosinophils Absolute: 0.6 10*3/uL (ref 0.0–0.7)
Lymphocytes Relative: 32.6 % (ref 12.0–46.0)
MCHC: 32.4 g/dL (ref 30.0–36.0)
MCV: 85.8 fl (ref 78.0–100.0)
Monocytes Absolute: 0.8 10*3/uL (ref 0.1–1.0)
Neutro Abs: 6.3 10*3/uL (ref 1.4–7.7)
Neutrophils Relative %: 54.5 % (ref 43.0–77.0)
RDW: 15.4 % — ABNORMAL HIGH (ref 11.5–14.6)

## 2013-02-19 LAB — POCT URINALYSIS DIPSTICK
Blood, UA: NEGATIVE
Ketones, UA: NEGATIVE
Nitrite, UA: NEGATIVE
Spec Grav, UA: 1.025

## 2013-02-21 ENCOUNTER — Encounter: Payer: Self-pay | Admitting: Internal Medicine

## 2013-02-21 LAB — URINE CULTURE

## 2013-02-21 NOTE — Assessment & Plan Note (Signed)
Has home O2.  Follow.  Encouraged her and her family to use.   

## 2013-02-21 NOTE — Assessment & Plan Note (Signed)
CT as outlined.  Saw Dr Sherene Sires.  Felt no further pulmonary w/up warranted.

## 2013-02-21 NOTE — Assessment & Plan Note (Signed)
Refuses treatment

## 2013-02-21 NOTE — Assessment & Plan Note (Signed)
Refuses treatment.  Follow.   

## 2013-02-21 NOTE — Assessment & Plan Note (Signed)
CT as outlined.  Saw Dr Sherene Sires.  Did not feel any further pulmonary w/up warranted.

## 2013-02-21 NOTE — Assessment & Plan Note (Signed)
S/p pacemaker.  Followed by cardiology.   

## 2013-02-21 NOTE — Assessment & Plan Note (Signed)
Has a documented history of anemia.  Hgb 09/21/12 normal.  Follow.  

## 2013-02-21 NOTE — Assessment & Plan Note (Signed)
Has been seeing Dr Schermerhorn.  Apparently has been released.  Caretaker states "nothing more can be done".    

## 2013-02-21 NOTE — Assessment & Plan Note (Signed)
Previous positive H. Pylori.  Treated. Currently asymptomatic.   

## 2013-02-21 NOTE — Assessment & Plan Note (Signed)
On thyroid replacement.  Follow tsh.  

## 2013-02-21 NOTE — Assessment & Plan Note (Signed)
Check urinalysis today

## 2013-02-21 NOTE — Assessment & Plan Note (Signed)
Refuses treatment.   Does not check sugars.

## 2013-02-21 NOTE — Progress Notes (Signed)
Subjective:    Patient ID: Kendra Sutton, female    DOB: 1921-10-16, 79 y.o.   MRN: [id number]  Urinary Tract Infection   79 year old female with past history of COPD on oxygen while she sleeps, peptic ulcer disease, hyperlipidemia, hypothyroidism, diabetes, sick sinus syndrome s/p pacemaker placement, depression and dementia.  She has also been followed by Dr Kendra Sutton for an endometrial mass.  She comes in today for a scheduled follow up.  She was recently admitted with shortness of breath due to acute COPD exacerbation and bronchitis.  She was given nebulizer treatments and abx.  She was also placed on prednisone.  Has home O2.  Her son accompanies her today.  History obtained from both of them.   Recently had a chest CT to f/u abnormal cxr.  See CT report for details.  Concern over possible aspiration vs MAI, etc.  Discussed this with her today and discussed with her son Kendra Sutton (who brought her today).  I had previously discussed results with Kendra Sutton (POA).  They were in agreement for further w/up.  Modified barium swallow was negative for aspiration per report.   Breathing appears to be relatively stable.  Some cough and congestion at her baseline.  No increased cough or congestion today.  Saw Dr Kendra Sutton.  Did not feel any other pulmonary w/up warranted.  States she is eating and drinking.      Past Medical History  Diagnosis Date  . COPD (chronic obstructive pulmonary disease)   . Pyuria     chronic  . Peptic ulcer     H. pylori per biopsy 7/03 - treated  . Hyperlipidemia   . Hypothyroidism   . Diabetes     refuses treatment  . Sick sinus syndrome     s/p pacemaker - Dr Kendra Sutton  . Depression   . Dementia   . FTT (failure to thrive) in adult   . Atrial fibrillation   . Anemia     Outpatient Encounter Prescriptions as of 02/19/2013  Medication Sig Dispense Refill  . albuterol (PROVENTIL HFA;VENTOLIN HFA) 108 (90 BASE) MCG/ACT inhaler Inhale 2 puffs into the lungs every 6 (six) hours  as needed for wheezing.  1 Inhaler  2  . aspirin 81 MG tablet Take 81 mg by mouth daily.      Marland Kitchen dextromethorphan 15 MG/5ML syrup Take 10 mLs by mouth 4 (four) times daily as needed for cough.      . Fluticasone-Salmeterol (ADVAIR) 100-50 MCG/DOSE AEPB Inhale 1 puff into the lungs every 12 (twelve) hours.      . Infant Care Products (DERMACLOUD) CREA Apply to affected area bid x 7-10 days.  430 g  0  . levothyroxine (SYNTHROID, LEVOTHROID) 75 MCG tablet Take 75 mcg by mouth daily.      . metoprolol (LOPRESSOR) 50 MG tablet Take 50 mg by mouth 2 (two) times daily.      . Multiple Vitamin (MULTIVITAMIN) tablet Take 1 tablet by mouth daily.      Marland Kitchen senna (SENOKOT) 176 MG/5ML SYRP One to Two tablets by mouth twice a day      . traZODone (DESYREL) 50 MG tablet Take 50 mg by mouth at bedtime as needed.        No facility-administered encounter medications on file as of 02/19/2013.    Review of Systems Patient denies any headache, lightheadedness or dizziness.  No increased cough and congestion.    No acid reflux.  No nausea or vomiting.  No no  abdominal pain.  No chest pain or tightness.  No diarrhea.  No fever.   Eating.  Lives with her two sons.  Overall they feel things are stable.         Objective:   Physical Exam  Filed Vitals:   02/19/13 1348  BP: 110/80  Pulse: 64  Temp: 98.8 F (60.56 C)   79 year old female in no acute distress.   HEENT:  Nares- clear.  Oropharynx - without lesions. NECK:  Supple.  Nontender.  No audible bruit.  HEART:  Appears to be regular. LUNGS:  Increased air movement.  No increased cough today.  No increased wheezing.       RADIAL PULSE:  Equal bilaterally.  ABDOMEN:  Soft, nontender.  Bowel sounds present and normal.    EXTREMITIES:  No increased edema present.      SKIN:  No lesions or rash.       Assessment & Plan:  CARDIOVASCULAR.  Stable.  Currently asymptomatic.    HEALTH MAINTENANCE.  Sees Dr Kendra Sutton for her gyn care.

## 2013-02-21 NOTE — Assessment & Plan Note (Signed)
Apparently has paroxysmal atrial fib.  Sees cardiology.  Appears to be in SR today.  Follow.   

## 2013-02-21 NOTE — Assessment & Plan Note (Signed)
Has known COPD.  Recently admitted with COPD exacerbation.  Treated with abx, prednisone and nebs.  Breathing stable today.  Encourage her to use her inhalers.

## 2013-02-24 ENCOUNTER — Other Ambulatory Visit: Payer: Self-pay | Admitting: *Deleted

## 2013-02-24 ENCOUNTER — Encounter: Payer: Self-pay | Admitting: *Deleted

## 2013-02-24 MED ORDER — MUPIROCIN CALCIUM 2 % EX CREA: TOPICAL_CREAM | Freq: Two times a day (BID) | CUTANEOUS | Status: AC

## 2013-02-24 NOTE — Telephone Encounter (Signed)
Called requesting cream for his mother eye-forgot to get it at her recent appt

## 2013-03-02 ENCOUNTER — Telehealth: Payer: Self-pay | Admitting: Internal Medicine

## 2013-03-02 NOTE — Telephone Encounter (Signed)
Pt's son is calling and wondering if pt had UTI ??? Please call Onalee Hua her son at [phone]

## 2013-03-02 NOTE — Telephone Encounter (Signed)
If change in mental status - I would recommend eval this pm (either acute care or ER pending symptoms)

## 2013-03-02 NOTE — Telephone Encounter (Signed)
Notified son of results (tried to reach him on 8/11 & mailed letter on 8/13). Son stated that caregiver reports that Kendra Sutton is not herself today. She has been hallucinating. Please call Kendra Sutton tomorrow at [phone] Or call Kendra Sutton at same number today.

## 2013-03-02 NOTE — Telephone Encounter (Signed)
noted 

## 2013-03-02 NOTE — Telephone Encounter (Signed)
Son aware & stated that he would discuss with his brother [name] & check her again tomorrow.

## 2013-03-03 ENCOUNTER — Other Ambulatory Visit: Payer: Self-pay | Admitting: Internal Medicine

## 2013-03-05 ENCOUNTER — Other Ambulatory Visit: Payer: Self-pay | Admitting: *Deleted

## 2013-03-05 MED ORDER — LEVOTHYROXINE SODIUM 75 MCG PO TABS: 75.0000 ug | ORAL_TABLET | Freq: Every day | ORAL | Status: AC

## 2013-03-30 ENCOUNTER — Telehealth: Payer: Self-pay | Admitting: *Deleted

## 2013-03-30 NOTE — Telephone Encounter (Signed)
If cough and rash - needs evaluation.

## 2013-03-30 NOTE — Telephone Encounter (Signed)
Son notified & scheduling appt with Raquel for tomorrow

## 2013-03-30 NOTE — Telephone Encounter (Signed)
[name]) reports that Kendra Sutton has a cough since around Friday. They have tried Robitussin & her blue inhaler relief. Caregiver also reports that she has a rash on her back. Please advise

## 2013-03-31 ENCOUNTER — Ambulatory Visit (INDEPENDENT_AMBULATORY_CARE_PROVIDER_SITE_OTHER): Payer: Medicare Other | Admitting: Adult Health

## 2013-03-31 ENCOUNTER — Encounter: Payer: Self-pay | Admitting: Adult Health

## 2013-03-31 VITALS — BP 106/68 | HR 72 | Temp 99.0°F | Resp 14 | Wt 117.0 lb

## 2013-03-31 DIAGNOSIS — R062 Wheezing: Secondary | ICD-10-CM

## 2013-03-31 DIAGNOSIS — K649 Unspecified hemorrhoids: Secondary | ICD-10-CM

## 2013-03-31 DIAGNOSIS — R05 Cough: Secondary | ICD-10-CM

## 2013-03-31 MED ORDER — HYDROCORTISONE ACETATE 25 MG RE SUPP: 25.0000 mg | Freq: Two times a day (BID) | RECTAL | Status: DC

## 2013-03-31 MED ORDER — ALBUTEROL SULFATE (2.5 MG/3ML) 0.083% IN NEBU
2.5000 mg | INHALATION_SOLUTION | Freq: Once | RESPIRATORY_TRACT | Status: AC
  Administered 2013-03-31: 2.5 mg via RESPIRATORY_TRACT

## 2013-03-31 MED ORDER — HYDROCORTISONE 2.5 % RE CREA: TOPICAL_CREAM | Freq: Two times a day (BID) | RECTAL | Status: DC

## 2013-03-31 MED ORDER — LEVOFLOXACIN 250 MG PO TABS: 250.0000 mg | ORAL_TABLET | Freq: Every day | ORAL | Status: DC

## 2013-03-31 MED ORDER — PREDNISONE 10 MG PO TABS: ORAL_TABLET | ORAL | Status: DC

## 2013-03-31 NOTE — Progress Notes (Signed)
Subjective:    Patient ID: Kendra Sutton, female    DOB: December 25, 1921, 79 y.o.   MRN: [id number]  HPI  Patient is a pleasant 79 y/o female with multiple medical problems including COPD, HLD, hypothyroidism, DM, dementia, afib, anemia, adult FTT who presents with worsening cough ongoing x 2 weeks. She has been taking Robitussin; however, no improvement. She denies shortness of breath. She presents to clinic with one of her sons and her caregiver. Son is also reporting that she is sometimes getting up in the middle of the night and wants to know if I could giver her something for anxiety. He is unable to specify why he thinks she is exhibiting anxiety other than she is waking up during the night.  Caregiver reports that patient has been c/o her hemorrhoids bothering her. She has been using preparation H for her symptoms with some improvement. Denies any bleeding from rectum.    Current Outpatient Prescriptions on File Prior to Visit  Medication Sig Dispense Refill  . aspirin 81 MG tablet Take 81 mg by mouth daily.      Marland Kitchen dextromethorphan 15 MG/5ML syrup Take 10 mLs by mouth 4 (four) times daily as needed for cough.      . Fluticasone-Salmeterol (ADVAIR) 100-50 MCG/DOSE AEPB Inhale 1 puff into the lungs every 12 (twelve) hours.      . Infant Care Products (DERMACLOUD) CREA Apply to affected area bid x 7-10 days.  430 g  0  . levothyroxine (SYNTHROID, LEVOTHROID) 75 MCG tablet Take 1 tablet (75 mcg total) by mouth daily.  30 tablet  5  . metoprolol (LOPRESSOR) 50 MG tablet Take 50 mg by mouth 2 (two) times daily.      . Multiple Vitamin (MULTIVITAMIN) tablet Take 1 tablet by mouth daily.      . mupirocin cream (BACTROBAN) 2 % Apply topically 2 (two) times daily.  15 g  0  . senna (SENOKOT) 176 MG/5ML SYRP One to Two tablets by mouth twice a day      . traZODone (DESYREL) 50 MG tablet Take 50 mg by mouth at bedtime as needed.       . VENTOLIN HFA 108 (90 BASE) MCG/ACT inhaler INHALE 2 PUFFS INTO THE  LUNGS EVERY 6 (SIX) HOURS AS NEEDED FOR WHEEZING.  18 each  2   No current facility-administered medications on file prior to visit.    Review of Systems  Constitutional: Positive for fever. Negative for chills.  HENT: Positive for postnasal drip.   Respiratory: Positive for cough. Negative for shortness of breath and wheezing.   Cardiovascular: Negative.   Gastrointestinal: Negative for abdominal pain and anal bleeding.       Hemorrhoids painful  Neurological: Negative for headaches.  Psychiatric/Behavioral: Negative for behavioral problems and agitation. The patient is not nervous/anxious.   All other systems reviewed and are negative.    BP 106/68  Pulse 72  Temp(Src) 99 F (37.2 C) (Oral)  Resp 14  Wt 117 lb (53.071 kg)  BMI 21.39 kg/m2  SpO2 93%    Objective:   Physical Exam  Constitutional:  Elderly female in NAD. Low grade fever.  HENT:  Head: Normocephalic and atraumatic.  Mouth/Throat: Oropharynx is clear and moist.  Neck: Normal range of motion.  Cardiovascular: Normal rate, regular rhythm and normal heart sounds.   Pulmonary/Chest: Effort normal. No respiratory distress. She has wheezes. She has no rales.  Coughing throughout visit.  Abdominal: Soft. Bowel sounds are normal.  Musculoskeletal: She exhibits  no edema.  Lymphadenopathy:    She has no cervical adenopathy.  Neurological: She is alert.  Psychiatric: She has a normal mood and affect. Her behavior is normal.  Engages and participates in conversation. Calm demeanor.       Assessment & Plan:

## 2013-03-31 NOTE — Assessment & Plan Note (Signed)
Insert one suppository twice a day for 14 days as needed for pain/discomfort from hemorrhoids. May also apply topical cream (hydrocortisone rectal cream) twice a day for 5-7 days.

## 2013-03-31 NOTE — Patient Instructions (Addendum)
  Start Levaquin 250 mg daily for 10 days.  Prednisone taper as follows:  Day #1 take 6 tablets Day #2 takes 5 and half tablets Day #3 take 5 tablets Day #4 take 4 and half tablets Day #5 take 4 tablets Day #6 take 3-1/2 tablets Day #7 take 3 tablets Day #8 take 2-1/2 tablets Day #9 take 2 tablets Day #10 take 1-1/2 tablets Day #11 take 1 tablet Day #12 take half a tablet and complete  For your hemorrhoids you may apply one suppository twice a day for 14 days as needed. May also apply topical cream (hydrocortisone rectal cream) twice a day for 5-7 days.

## 2013-03-31 NOTE — Assessment & Plan Note (Addendum)
Hx of COPD. Cough ongoing x 2 weeks. Pt with low grade fever. Nebulizer treatment in clinic for wheezing. Start Levaquin 250 mg x 10 days. Start prednisone taper 6 tabs decrease by 1/2 tab daily until done.

## 2013-04-01 ENCOUNTER — Telehealth: Payer: Self-pay | Admitting: Internal Medicine

## 2013-04-01 MED ORDER — HYDROCORTISONE 2.5 % RE CREA: TOPICAL_CREAM | Freq: Two times a day (BID) | RECTAL | Status: AC

## 2013-04-01 MED ORDER — LEVOFLOXACIN 250 MG PO TABS: 250.0000 mg | ORAL_TABLET | Freq: Every day | ORAL | Status: DC

## 2013-04-01 MED ORDER — HYDROCORTISONE ACETATE 25 MG RE SUPP: 25.0000 mg | Freq: Two times a day (BID) | RECTAL | Status: AC

## 2013-04-01 MED ORDER — PREDNISONE 10 MG PO TABS: ORAL_TABLET | ORAL | Status: DC

## 2013-04-01 NOTE — Telephone Encounter (Signed)
All the medication's that were called into [facility] please send to [facility]

## 2013-04-01 NOTE — Telephone Encounter (Signed)
[facility] having computer problems, have not received the Rxs. Rx sent to [facility]

## 2013-04-05 ENCOUNTER — Other Ambulatory Visit: Payer: Self-pay | Admitting: Adult Health

## 2013-04-05 ENCOUNTER — Telehealth: Payer: Self-pay | Admitting: Internal Medicine

## 2013-04-05 DIAGNOSIS — R05 Cough: Secondary | ICD-10-CM

## 2013-04-05 MED ORDER — BENZONATATE 100 MG PO CAPS: 100.0000 mg | ORAL_CAPSULE | Freq: Two times a day (BID) | ORAL | Status: DC | PRN

## 2013-04-05 NOTE — Telephone Encounter (Signed)
Pt's son notified of Rx and xray, verbalized understanding to take her for the xray tomorrow morning.

## 2013-04-05 NOTE — Telephone Encounter (Signed)
The patient's cough is not any better. Patient coughing severely .

## 2013-04-05 NOTE — Telephone Encounter (Signed)
We can try Tessalon for cough. I also want to get an xray at our [facility] office.

## 2013-04-05 NOTE — Telephone Encounter (Signed)
Spoke with pt's son, Fayrene Fearing, states pt is taking Levaquin and Prednisone as directed. States continues to have cough, has improved some.  Is unsure if it is a productive cough. Denies any new symptoms or fever, shortness of breath, wheezing. Advised to continue taking antibiotic and prednisone as directed and will call him back with any further advice.

## 2013-04-06 ENCOUNTER — Ambulatory Visit (INDEPENDENT_AMBULATORY_CARE_PROVIDER_SITE_OTHER)
Admission: RE | Admit: 2013-04-06 | Discharge: 2013-04-06 | Disposition: A | Discharge status: Home / Self Care | Payer: Medicare Other | Source: Ambulatory Visit | Attending: Adult Health | Admitting: Adult Health

## 2013-04-06 DIAGNOSIS — R05 Cough: Secondary | ICD-10-CM

## 2013-05-20 ENCOUNTER — Telehealth: Payer: Self-pay | Admitting: Internal Medicine

## 2013-05-20 NOTE — Telephone Encounter (Signed)
Called to inform Dr. Lorin Picket that the pt has continued cough.  Aide states the pt has a lot of problems that she does not feel are being told to Dr. Lorin Picket.  States she is the aide and comes in on Tuesdays and Thursdays to help the pt.  States the pt is not being bathed or taking showers between the aide's visit on Thursday until the next Tuesday when she comes in.  The pt is also refusing to take showers, but is being sponged down by the aide on some occasions.  States the pt is wiping the wrong way again and is itchy and red in her vaginal area.  States the pt has sores in her mouth and is complaining that her mouth hurts.  Pt is refusing to drink water and aide thinks she is dehydrated.  States the pt will not keep her oxygen on.  Pt is not eating well, refuses.  Will drink a boost on occasion.  Pt is refusing to wear her glasses and hearing aids.  Aide is concerned that the pt has a lot of problems that are not being addressed by family members.

## 2013-05-20 NOTE — Telephone Encounter (Signed)
The patient has previously informed me that I can talk to her sons.  Has asked me to communicate through them.  She has previously requested me to not communicate her medical issues with her previous aide.  There is no name on this message, but I assume this is the same person calling.  Can let her know that we can talk with sons about concerns.  Also, needs an appt for evaluation with these issues.

## 2013-05-20 NOTE — Telephone Encounter (Signed)
Spoke with [name]), Kendra Sutton has an appt with you on Monday at 2:00.

## 2013-05-24 ENCOUNTER — Ambulatory Visit (INDEPENDENT_AMBULATORY_CARE_PROVIDER_SITE_OTHER): Payer: Medicare Other | Admitting: Internal Medicine

## 2013-05-24 ENCOUNTER — Encounter: Payer: Self-pay | Admitting: Internal Medicine

## 2013-05-24 VITALS — BP 110/80 | HR 82 | Temp 98.7°F | Ht 62.0 in

## 2013-05-24 DIAGNOSIS — R0902 Hypoxemia: Secondary | ICD-10-CM

## 2013-05-24 DIAGNOSIS — R05 Cough: Secondary | ICD-10-CM

## 2013-05-24 DIAGNOSIS — F0390 Unspecified dementia without behavioral disturbance: Secondary | ICD-10-CM

## 2013-05-24 DIAGNOSIS — K279 Peptic ulcer, site unspecified, unspecified as acute or chronic, without hemorrhage or perforation: Secondary | ICD-10-CM

## 2013-05-24 DIAGNOSIS — N9489 Other specified conditions associated with female genital organs and menstrual cycle: Secondary | ICD-10-CM

## 2013-05-24 DIAGNOSIS — R918 Other nonspecific abnormal finding of lung field: Secondary | ICD-10-CM

## 2013-05-24 DIAGNOSIS — E119 Type 2 diabetes mellitus without complications: Secondary | ICD-10-CM

## 2013-05-24 DIAGNOSIS — J449 Chronic obstructive pulmonary disease, unspecified: Secondary | ICD-10-CM

## 2013-05-24 DIAGNOSIS — E785 Hyperlipidemia, unspecified: Secondary | ICD-10-CM

## 2013-05-24 DIAGNOSIS — R630 Anorexia: Secondary | ICD-10-CM

## 2013-05-24 DIAGNOSIS — R9389 Abnormal findings on diagnostic imaging of other specified body structures: Secondary | ICD-10-CM

## 2013-05-24 DIAGNOSIS — D72829 Elevated white blood cell count, unspecified: Secondary | ICD-10-CM

## 2013-05-24 DIAGNOSIS — E039 Hypothyroidism, unspecified: Secondary | ICD-10-CM

## 2013-05-24 DIAGNOSIS — I4891 Unspecified atrial fibrillation: Secondary | ICD-10-CM

## 2013-05-24 DIAGNOSIS — D649 Anemia, unspecified: Secondary | ICD-10-CM

## 2013-05-24 MED ORDER — LEVOFLOXACIN 500 MG PO TABS: 500.0000 mg | ORAL_TABLET | Freq: Every day | ORAL | Status: DC

## 2013-05-24 NOTE — Progress Notes (Signed)
Pre-visit discussion using our clinic review tool. No additional management support is needed unless otherwise documented below in the visit note.  

## 2013-05-25 LAB — COMPREHENSIVE METABOLIC PANEL
Albumin: 3.8 g/dL (ref 3.5–5.2)
CO2: 29 mEq/L (ref 19–32)
Calcium: 9.3 mg/dL (ref 8.4–10.5)
GFR: 78.49 mL/min (ref >60.00)
Glucose, Bld: 259 mg/dL — ABNORMAL HIGH (ref 70–99)
Potassium: 4.6 mEq/L (ref 3.5–5.1)
Sodium: 135 mEq/L (ref 135–145)
Total Bilirubin: 0.5 mg/dL (ref 0.3–1.2)
Total Protein: 6.8 g/dL (ref 6.0–8.3)

## 2013-05-25 LAB — CBC WITH DIFFERENTIAL/PLATELET
Eosinophils Relative: 3.7 % (ref 0.0–5.0)
HCT: 36.3 % (ref 36.0–46.0)
Hemoglobin: 12.1 g/dL (ref 12.0–15.0)
Lymphs Abs: 3.1 10*3/uL (ref 0.7–4.0)
Monocytes Relative: 6.1 % (ref 3.0–12.0)
Neutro Abs: 6.5 10*3/uL (ref 1.4–7.7)
Neutrophils Relative %: 60.6 % (ref 43.0–77.0)
Platelets: 320 10*3/uL (ref 150.0–400.0)
WBC: 10.7 10*3/uL — ABNORMAL HIGH (ref 4.5–10.5)

## 2013-05-25 LAB — TSH: TSH: 3.22 u[IU]/mL (ref 0.35–5.50)

## 2013-05-26 ENCOUNTER — Other Ambulatory Visit: Payer: Self-pay | Admitting: Internal Medicine

## 2013-05-26 ENCOUNTER — Encounter: Payer: Self-pay | Admitting: Internal Medicine

## 2013-05-26 DIAGNOSIS — R739 Hyperglycemia, unspecified: Secondary | ICD-10-CM

## 2013-05-26 DIAGNOSIS — E7800 Pure hypercholesterolemia, unspecified: Secondary | ICD-10-CM

## 2013-05-26 NOTE — Assessment & Plan Note (Signed)
Refuses treatment.  Follow.   

## 2013-05-26 NOTE — Progress Notes (Signed)
Subjective:    Patient ID: Kendra Sutton, female    DOB: [birth year], 79 y.o.   MRN: [id number]  Cough  79 year old female with past history of COPD on oxygen while she sleeps, peptic ulcer disease, hyperlipidemia, hypothyroidism, diabetes, sick sinus syndrome s/p pacemaker placement, depression and dementia.  She has also been followed by Dr Feliberto Gottron for an endometrial mass.  She comes in today for a scheduled follow up.  She is accompanied by her son and her caretaker.  Has home O2.  Unclear how compliant she is at home with her oxygen and her inhalers.   Recently had a chest CT to f/u abnormal cxr.  See CT report for details.  Concern over possible aspiration vs MAI, etc.  Saw pulmonary.   See Dr Thurston Hole note for details.  States she is eating and drinking.  Some decreased appetite and is a "picky eater".   She is having some increased cough and congestion.  Is walking some.  Son reports that she does not move around a lot.  She lives with both of her sons.  Bowels stable.  No urinary burning or vaginal itching or irritation.      Past Medical History  Diagnosis Date  . COPD (chronic obstructive pulmonary disease)   . Pyuria     chronic  . Peptic ulcer     H. pylori per biopsy 7/03 - treated  . Hyperlipidemia   . Hypothyroidism   . Diabetes     refuses treatment  . Sick sinus syndrome     s/p pacemaker - Dr Darrold Junker  . Depression   . Dementia   . FTT (failure to thrive) in adult   . Atrial fibrillation   . Anemia     Outpatient Encounter Prescriptions as of 05/24/2013  Medication Sig  . aspirin 81 MG tablet Take 81 mg by mouth daily.  Marland Kitchen dextromethorphan 15 MG/5ML syrup Take 10 mLs by mouth 4 (four) times daily as needed for cough.  . Fluticasone-Salmeterol (ADVAIR) 100-50 MCG/DOSE AEPB Inhale 1 puff into the lungs every 12 (twelve) hours.  . hydrocortisone (ANUSOL-HC) 25 MG suppository Place 1 suppository (25 mg total) rectally 2 (two) times daily.  . hydrocortisone (PROCTOSOL  HC) 2.5 % rectal cream Place rectally 2 (two) times daily.  . Infant Care Products (DERMACLOUD) CREA Apply to affected area bid x 7-10 days.  Marland Kitchen levothyroxine (SYNTHROID, LEVOTHROID) 75 MCG tablet Take 1 tablet (75 mcg total) by mouth daily.  . metoprolol (LOPRESSOR) 50 MG tablet Take 50 mg by mouth 2 (two) times daily.  . Multiple Vitamin (MULTIVITAMIN) tablet Take 1 tablet by mouth daily.  . mupirocin cream (BACTROBAN) 2 % Apply topically 2 (two) times daily.  Marland Kitchen senna (SENOKOT) 176 MG/5ML SYRP One to Two tablets by mouth twice a day  . traZODone (DESYREL) 50 MG tablet Take 50 mg by mouth at bedtime as needed.   . VENTOLIN HFA 108 (90 BASE) MCG/ACT inhaler INHALE 2 PUFFS INTO THE LUNGS EVERY 6 (SIX) HOURS AS NEEDED FOR WHEEZING.  Marland Kitchen levofloxacin (LEVAQUIN) 500 MG tablet Take 1 tablet (500 mg total) by mouth daily.  . [DISCONTINUED] benzonatate (TESSALON) 100 MG capsule Take 1 capsule (100 mg total) by mouth 2 (two) times daily as needed for cough.  . [DISCONTINUED] levofloxacin (LEVAQUIN) 250 MG tablet Take 1 tablet (250 mg total) by mouth daily.  . [DISCONTINUED] predniSONE (DELTASONE) 10 MG tablet Start with 6 tablets and decrease by 1/2 tablet daily  Review of Systems  Respiratory: Positive for cough.   Patient denies any headache, lightheadedness or dizziness.  Does report increased cough and congestion.    No acid reflux.  No nausea or vomiting.  No no abdominal pain.  No chest pain or tightness.  No diarrhea.  No fever.   Eating.  Lives with her two sons.  No urinary or vaginal issues.       Objective:   Physical Exam  Filed Vitals:   05/24/13 1410  BP: 110/80  Pulse: 82  Temp: 98.7 F (46.31 C)   79 year old female in no acute distress.   HEENT:  Nares- clear.  Oropharynx - without lesions. NECK:  Supple.  Nontender.  No audible bruit.  HEART:  Appears to be regular. LUNGS:  Increased air movement.   No increased wheezing.  Increased cough with expiration.     RADIAL PULSE:   Equal bilaterally.  ABDOMEN:  Soft, nontender.  Bowel sounds present and normal.    EXTREMITIES:  No increased edema present.       Assessment & Plan:  CARDIOVASCULAR.  Stable.  Currently asymptomatic.     I spent 40 minutes with the patient and more than 50% of the time was spent in consultation regarding the above.

## 2013-05-26 NOTE — Assessment & Plan Note (Signed)
On thyroid replacement.  Follow tsh.  

## 2013-05-26 NOTE — Assessment & Plan Note (Signed)
Refuses treatment

## 2013-05-26 NOTE — Assessment & Plan Note (Signed)
Has been seeing Dr Schermerhorn.  Apparently has been released.  Caretaker states "nothing more can be done".    

## 2013-05-26 NOTE — Progress Notes (Signed)
Order placed for f/u labs.  

## 2013-05-26 NOTE — Assessment & Plan Note (Signed)
Has home O2.  Follow.  Encouraged her and her family to use.   

## 2013-05-26 NOTE — Assessment & Plan Note (Signed)
Treat current respiratory infection.

## 2013-05-26 NOTE — Assessment & Plan Note (Signed)
Previous positive H. Pylori.  Treated. Currently asymptomatic.   

## 2013-05-26 NOTE — Assessment & Plan Note (Signed)
Hx of COPD.   Now with increased cough and congestion.  Start Levaquin as directed.  Discussed with her regarding the need to use the inhalers regularly.  Follow.

## 2013-05-26 NOTE — Assessment & Plan Note (Signed)
Has known COPD.  Treat the current infection.   Encourage her to use her inhalers.

## 2013-05-26 NOTE — Assessment & Plan Note (Signed)
Has a documented history of anemia.  Hgb 09/21/12 normal.  Follow.  

## 2013-05-26 NOTE — Assessment & Plan Note (Signed)
Apparently has paroxysmal atrial fib.  Sees cardiology.  Appears to be in SR today.  Follow.   

## 2013-05-26 NOTE — Assessment & Plan Note (Signed)
CT as outlined.  Saw Dr Sherene Sires.  Did not feel any further pulmonary w/up warranted.

## 2013-05-26 NOTE — Assessment & Plan Note (Signed)
Refuses treatment.   Does not check sugars.  Check metabolic panel today.

## 2013-06-04 ENCOUNTER — Other Ambulatory Visit (INDEPENDENT_AMBULATORY_CARE_PROVIDER_SITE_OTHER): Payer: Medicare Other

## 2013-06-04 DIAGNOSIS — R739 Hyperglycemia, unspecified: Secondary | ICD-10-CM

## 2013-06-04 DIAGNOSIS — E7800 Pure hypercholesterolemia, unspecified: Secondary | ICD-10-CM

## 2013-06-04 DIAGNOSIS — R7309 Other abnormal glucose: Secondary | ICD-10-CM

## 2013-06-04 LAB — LIPID PANEL
Cholesterol: 207 mg/dL — ABNORMAL HIGH (ref 0–200)
HDL: 42.1 mg/dL (ref >39.00)
VLDL: 33.6 mg/dL (ref 0.0–40.0)

## 2013-06-04 LAB — HEMOGLOBIN A1C: Hgb A1c MFr Bld: 7.2 % — ABNORMAL HIGH (ref 4.6–6.5)

## 2013-06-04 LAB — LDL CHOLESTEROL, DIRECT: Direct LDL: 149.7 mg/dL

## 2013-06-05 LAB — GLUCOSE, FASTING: Glucose, Fasting: 105 mg/dL — ABNORMAL HIGH (ref 70–99)

## 2013-06-07 ENCOUNTER — Encounter: Payer: Self-pay | Admitting: *Deleted

## 2013-07-06 ENCOUNTER — Observation Stay: Payer: Self-pay | Admitting: Internal Medicine

## 2013-07-06 LAB — URINALYSIS, COMPLETE
Bacteria: NONE SEEN
Bilirubin,UR: NEGATIVE
Blood: NEGATIVE
Glucose,UR: 50 mg/dL (ref 0–75)
Hyaline Cast: 2
Nitrite: NEGATIVE
Ph: 5 (ref 4.5–8.0)
RBC,UR: 18 /HPF (ref 0–5)
Specific Gravity: 1.021 (ref 1.003–1.030)
Squamous Epithelial: 11
WBC UR: 334 /HPF (ref 0–5)

## 2013-07-06 LAB — CBC WITH DIFFERENTIAL/PLATELET
Eosinophil #: 0.3 10*3/uL (ref 0.0–0.7)
Eosinophil %: 3.1 %
HCT: 38.6 % (ref 35.0–47.0)
MCHC: 32.3 g/dL (ref 32.0–36.0)
MCV: 83 fL (ref 80–100)
Monocyte #: 0.9 x10 3/mm (ref 0.2–0.9)
Neutrophil %: 59.9 %
Platelet: 368 10*3/uL (ref 150–440)
RDW: 15.5 % — ABNORMAL HIGH (ref 11.5–14.5)

## 2013-07-06 LAB — COMPREHENSIVE METABOLIC PANEL
BUN: 15 mg/dL (ref 7–18)
Calcium, Total: 9.5 mg/dL (ref 8.5–10.1)
Co2: 32 mmol/L (ref 21–32)
EGFR (Non-African Amer.): >60
Osmolality: 275 (ref 275–301)
Potassium: 4.2 mmol/L (ref 3.5–5.1)
SGOT(AST): 23 U/L (ref 15–37)
SGPT (ALT): 19 U/L (ref 12–78)

## 2013-07-07 LAB — URINE CULTURE

## 2013-07-08 LAB — CREATININE, SERUM
EGFR (African American): >60
EGFR (Non-African Amer.): >60

## 2013-07-12 ENCOUNTER — Telehealth: Payer: Self-pay | Admitting: Internal Medicine

## 2013-07-12 NOTE — Telephone Encounter (Signed)
The patient is needing a follow up appointment scheduled from her hospital stay with in 2 weeks. Please advise I don't have any available slots

## 2013-07-12 NOTE — Telephone Encounter (Signed)
Can put her in on 07/22/13 at 11:45 for hospital follow up.  Will need records.

## 2013-07-12 NOTE — Telephone Encounter (Signed)
Please advise 

## 2013-07-13 NOTE — Telephone Encounter (Signed)
Please call patient to schedule (Records have been requested)

## 2013-07-13 NOTE — Telephone Encounter (Signed)
The patient's [name] called back and I confirmed the date and time for her January appt.

## 2013-07-22 ENCOUNTER — Other Ambulatory Visit: Payer: Self-pay | Admitting: Internal Medicine

## 2013-07-22 ENCOUNTER — Ambulatory Visit (INDEPENDENT_AMBULATORY_CARE_PROVIDER_SITE_OTHER): Payer: Medicare Other | Admitting: Internal Medicine

## 2013-07-22 ENCOUNTER — Encounter: Payer: Self-pay | Admitting: Internal Medicine

## 2013-07-22 VITALS — BP 106/60 | HR 70 | Temp 98.8°F | Resp 12 | Ht 62.0 in | Wt 127.5 lb

## 2013-07-22 DIAGNOSIS — B351 Tinea unguium: Secondary | ICD-10-CM

## 2013-07-22 DIAGNOSIS — E785 Hyperlipidemia, unspecified: Secondary | ICD-10-CM

## 2013-07-22 DIAGNOSIS — D649 Anemia, unspecified: Secondary | ICD-10-CM

## 2013-07-22 DIAGNOSIS — K279 Peptic ulcer, site unspecified, unspecified as acute or chronic, without hemorrhage or perforation: Secondary | ICD-10-CM

## 2013-07-22 DIAGNOSIS — R8290 Unspecified abnormal findings in urine: Secondary | ICD-10-CM

## 2013-07-22 DIAGNOSIS — I4891 Unspecified atrial fibrillation: Secondary | ICD-10-CM

## 2013-07-22 DIAGNOSIS — R059 Cough, unspecified: Secondary | ICD-10-CM

## 2013-07-22 DIAGNOSIS — J449 Chronic obstructive pulmonary disease, unspecified: Secondary | ICD-10-CM

## 2013-07-22 DIAGNOSIS — E119 Type 2 diabetes mellitus without complications: Secondary | ICD-10-CM

## 2013-07-22 DIAGNOSIS — R918 Other nonspecific abnormal finding of lung field: Secondary | ICD-10-CM

## 2013-07-22 DIAGNOSIS — E039 Hypothyroidism, unspecified: Secondary | ICD-10-CM

## 2013-07-22 DIAGNOSIS — R82998 Other abnormal findings in urine: Secondary | ICD-10-CM

## 2013-07-22 DIAGNOSIS — F0390 Unspecified dementia without behavioral disturbance: Secondary | ICD-10-CM

## 2013-07-22 DIAGNOSIS — R05 Cough: Secondary | ICD-10-CM

## 2013-07-22 LAB — POCT URINALYSIS DIPSTICK
Bilirubin, UA: NEGATIVE
GLUCOSE UA: NEGATIVE
Leukocytes, UA: NEGATIVE
Nitrite, UA: NEGATIVE
PH UA: 6
PROTEIN UA: NEGATIVE
RBC UA: NEGATIVE
SPEC GRAV UA: 1.025
UROBILINOGEN UA: 0.2

## 2013-07-22 MED ORDER — CEPHALEXIN 500 MG PO CAPS: 500.0000 mg | ORAL_CAPSULE | Freq: Two times a day (BID) | ORAL | Status: DC

## 2013-07-22 NOTE — Progress Notes (Signed)
Pre visit review using our clinic review tool, if applicable. No additional management support is needed unless otherwise documented below in the visit note. 

## 2013-07-23 ENCOUNTER — Other Ambulatory Visit: Payer: Self-pay | Admitting: *Deleted

## 2013-07-23 MED ORDER — BENZONATATE 100 MG PO CAPS: 100.0000 mg | ORAL_CAPSULE | Freq: Three times a day (TID) | ORAL | Status: DC | PRN

## 2013-07-23 MED ORDER — QUETIAPINE FUMARATE 25 MG PO TABS: 25.0000 mg | ORAL_TABLET | Freq: Every day | ORAL | Status: DC

## 2013-07-25 ENCOUNTER — Encounter: Payer: Self-pay | Admitting: Internal Medicine

## 2013-07-25 DIAGNOSIS — R8290 Unspecified abnormal findings in urine: Secondary | ICD-10-CM | POA: Insufficient documentation

## 2013-07-25 DIAGNOSIS — B351 Tinea unguium: Secondary | ICD-10-CM | POA: Insufficient documentation

## 2013-07-25 NOTE — Assessment & Plan Note (Signed)
Apparently has paroxysmal atrial fib.  Sees cardiology.  Appears to be in SR today.  Follow.   

## 2013-07-25 NOTE — Assessment & Plan Note (Signed)
Previous positive H. Pylori.  Treated. Currently asymptomatic.   

## 2013-07-25 NOTE — Assessment & Plan Note (Signed)
Hx of COPD.   Cough and congestion better.  Discussed with her regarding the need to use the inhalers regularly.  Follow.  Use O2.

## 2013-07-25 NOTE — Assessment & Plan Note (Signed)
Refuses treatment.  Follow.   

## 2013-07-25 NOTE — Assessment & Plan Note (Signed)
Check urinalysis to confirm no infection.  

## 2013-07-25 NOTE — Assessment & Plan Note (Signed)
Toe nail fungus on exam.  Caretaker tried to cut her toe nails.  Some minimal erythema - skin surrounding her toes.  No increased erythema extending up her toes.  Start keflex.  Refer to podiatry.  Further w/up pening.  Diminished pulses - DP.  Decline referral to vascular at this time.  Follow.

## 2013-07-25 NOTE — Assessment & Plan Note (Signed)
CT as outlined.  Saw pulmonary.  Refer to their note for details.  Follow.

## 2013-07-25 NOTE — Assessment & Plan Note (Signed)
On thyroid replacement.  Follow tsh.  

## 2013-07-25 NOTE — Assessment & Plan Note (Signed)
Does not check sugars.  Last a1c 7.2.  Follow.

## 2013-07-25 NOTE — Assessment & Plan Note (Signed)
Has a documented history of anemia.  Hgb 09/21/12 normal.  Follow.

## 2013-07-25 NOTE — Assessment & Plan Note (Signed)
Refuses treatment

## 2013-07-25 NOTE — Assessment & Plan Note (Signed)
Has known COPD.  Encourage her to use her inhalers.   Cough better.  Breathing better.

## 2013-07-25 NOTE — Progress Notes (Signed)
Subjective:    Patient ID: Kendra EatonKatie Sutton, female    DOB: 11/13/1921, 80 y.o.   MRN: [id number]  Cough  80 year old female with past history of COPD on oxygen while she sleeps, peptic ulcer disease, hyperlipidemia, hypothyroidism, diabetes, sick sinus syndrome s/p pacemaker placement, depression and dementia.  She has also been followed by Dr Feliberto GottronSchermerhorn for an endometrial mass.  She comes in today for a scheduled follow up/hospital follow up. She is accompanied by her son and her caretaker.  Has home O2.  Unclear how compliant she is at home with her oxygen and her inhalers.   Recently had a chest CT to f/u abnormal cxr.  See CT report for details.  Concern over possible aspiration vs MAI, etc.  Saw pulmonary.   See Dr Thurston HoleWert's note for details.  States she is eating and drinking some.  Recently hospitalized with right lower lobe pneumonia.  Better.  Still some cough but better.   Has finished her abx and prednisone.  Is walking some.  Son reports that she does not move around a lot.  She lives with both of her sons.  Bowels stable.  No urinary burning or vaginal itching or irritation.  Was having issues with sun downing in the hospital.  Family had reported some issues at home as well.  She was started on seroquel 25mg  q hs while in the hospital.  Has been doing well with this.  Would like a refill.     Past Medical History  Diagnosis Date  . COPD (chronic obstructive pulmonary disease)   . Pyuria     chronic  . Peptic ulcer     H. pylori per biopsy 7/03 - treated  . Hyperlipidemia   . Hypothyroidism   . Diabetes     refuses treatment  . Sick sinus syndrome     s/p pacemaker - Dr Darrold JunkerParaschos  . Depression   . Dementia   . FTT (failure to thrive) in adult   . Atrial fibrillation   . Anemia     Outpatient Encounter Prescriptions as of 07/22/2013  Medication Sig  . aspirin 81 MG tablet Take 81 mg by mouth daily.  . Fluticasone-Salmeterol (ADVAIR) 100-50 MCG/DOSE AEPB Inhale 1 puff into the  lungs every 12 (twelve) hours.  . hydrocortisone (ANUSOL-HC) 25 MG suppository Place 1 suppository (25 mg total) rectally 2 (two) times daily.  . hydrocortisone (PROCTOSOL HC) 2.5 % rectal cream Place rectally 2 (two) times daily.  . Infant Care Products (DERMACLOUD) CREA Apply to affected area bid x 7-10 days.  Marland Kitchen. levothyroxine (SYNTHROID, LEVOTHROID) 75 MCG tablet Take 1 tablet (75 mcg total) by mouth daily.  . metoprolol (LOPRESSOR) 50 MG tablet Take 25 mg by mouth 2 (two) times daily.   . Multiple Vitamin (MULTIVITAMIN) tablet Take 1 tablet by mouth daily.  . mupirocin cream (BACTROBAN) 2 % Apply topically 2 (two) times daily.  Marland Kitchen. senna (SENOKOT) 176 MG/5ML SYRP One to Two tablets by mouth twice a day  . traZODone (DESYREL) 50 MG tablet Take 50 mg by mouth at bedtime as needed.   . VENTOLIN HFA 108 (90 BASE) MCG/ACT inhaler INHALE 2 PUFFS INTO THE LUNGS EVERY 6 (SIX) HOURS AS NEEDED FOR WHEEZING.  . [DISCONTINUED] QUEtiapine (SEROQUEL) 25 MG tablet Take 25 mg by mouth at bedtime.  . cephALEXin (KEFLEX) 500 MG capsule Take 1 capsule (500 mg total) by mouth 2 (two) times daily.  . [DISCONTINUED] dextromethorphan 15 MG/5ML syrup Take 10 mLs by  mouth 4 (four) times daily as needed for cough.  . [DISCONTINUED] levofloxacin (LEVAQUIN) 500 MG tablet Take 1 tablet (500 mg total) by mouth daily.    Review of Systems  Respiratory: Positive for cough.   Patient denies any headache, lightheadedness or dizziness.  Does report some cough and congestion. Better.   No acid reflux.  No nausea or vomiting.  No no abdominal pain.  No chest pain or tightness.  No diarrhea.  No fever.   Eating.  Lives with her two sons.  No urinary or vaginal issues.  Recently admitted wit pneumonia.  Treated.  Doing better.  Some concern regarding a urinary tract infection. seroquel is helping at night.  Concern regarding her toes.  Caretaker cut her toes.  Some increased redness.       Objective:   Physical Exam  Filed  Vitals:   07/22/13 1157  BP: 106/60  Pulse: 70  Temp: 98.8 F (37.1 C)  Resp: 39   80 year old female in no acute distress.   HEENT:  Nares- clear.  Oropharynx - without lesions. NECK:  Supple.  Nontender.  No audible bruit.  HEART:  Appears to be regular. LUNGS:  Increased air movement.   No increased wheezing.  Increased cough with expiration.     RADIAL PULSE:  Equal bilaterally.  ABDOMEN:  Soft, nontender.  Bowel sounds present and normal.    EXTREMITIES:  No increased edema present.  Some pedal edema - left foot.   SKIN:  Minimal erythema around toes.  Minimal tenderness.  No redness or swelling extending up her legs.      Assessment & Plan:  CARDIOVASCULAR.  Stable.  Currently asymptomatic.     I spent 40 minutes with the patient and more than 50% of the time was spent in consultation regarding the above.

## 2013-07-26 ENCOUNTER — Encounter: Payer: Self-pay | Admitting: Emergency Medicine

## 2013-07-26 LAB — CULTURE, URINE COMPREHENSIVE

## 2013-07-27 ENCOUNTER — Telehealth: Payer: Self-pay | Admitting: *Deleted

## 2013-07-27 NOTE — Telephone Encounter (Signed)
Per Dr. Lorin PicketScott, she has not called. I believe this is regarding the referral appt.

## 2013-07-27 NOTE — Telephone Encounter (Signed)
Pt states that he was returning your call. He will be there the rest of today. But will not be there tomorrow.

## 2013-07-27 NOTE — Telephone Encounter (Signed)
I mailed them a letter with apt information from the referral since I couldn't contact them. Just tried again got VM

## 2013-08-03 ENCOUNTER — Other Ambulatory Visit: Payer: Self-pay | Admitting: Internal Medicine

## 2013-08-04 ENCOUNTER — Other Ambulatory Visit: Payer: Self-pay | Admitting: Internal Medicine

## 2013-08-06 ENCOUNTER — Ambulatory Visit (INDEPENDENT_AMBULATORY_CARE_PROVIDER_SITE_OTHER): Payer: Medicare Other | Admitting: Podiatry

## 2013-08-06 ENCOUNTER — Encounter: Payer: Self-pay | Admitting: Podiatry

## 2013-08-06 VITALS — BP 121/76 | HR 76 | Resp 16 | Ht 64.0 in | Wt 127.0 lb

## 2013-08-06 DIAGNOSIS — M79609 Pain in unspecified limb: Secondary | ICD-10-CM

## 2013-08-06 DIAGNOSIS — B351 Tinea unguium: Secondary | ICD-10-CM

## 2013-08-06 DIAGNOSIS — I739 Peripheral vascular disease, unspecified: Secondary | ICD-10-CM

## 2013-08-06 NOTE — Progress Notes (Signed)
Subjective:     Patient ID: Kendra EatonKatie Sutton, female   DOB: 03/23/1922, 80 y.o.   MRN: [id number]  HPI patient presents with painful nail disease 1-5 both feet and redness which has occurred in the left foot of several days duration. Presents with caregiver's   Review of Systems     Objective:   Physical Exam Diminished vascular status with diminished pulses bilateral both DP and PT and redness to the left foot. Nail disease 1-5 both feet and thickness and pain when palpated    Assessment:     Mycotic nails with pain and possibilities for PVD condition    Plan:     Debrided nails and discussed with them PVD and I am been referred to vascular center for evaluation next week.

## 2013-08-09 ENCOUNTER — Telehealth: Payer: Self-pay | Admitting: Internal Medicine

## 2013-08-09 NOTE — Telephone Encounter (Signed)
Pt son called to check status of medication refill on medication to keep her from blacking out, did not know name  (?Seroquel).  States pharmacy faxed refill request.

## 2013-08-09 NOTE — Telephone Encounter (Signed)
I contacted the pharmacy & was notified that the Seroquel was filled & picked up on 07/23/13. I spoke with son & he found the medication

## 2013-08-10 ENCOUNTER — Ambulatory Visit: Payer: Self-pay | Admitting: Podiatry

## 2013-08-17 ENCOUNTER — Ambulatory Visit: Payer: Self-pay | Admitting: Podiatry

## 2013-08-26 ENCOUNTER — Telehealth: Payer: Self-pay | Admitting: Internal Medicine

## 2013-08-26 NOTE — Telephone Encounter (Signed)
Son called to rs appt 2/13 for Ms. Economou.  Wanted her to come later the same day around 4 p.m. or on Monday 2/16.  Advised there are no appt available at those times.  Would like a call from Dr. Lorin PicketScott for when she can be seen.  Appt 2/13 cancelled, no rs appt at this time.

## 2013-08-26 NOTE — Telephone Encounter (Signed)
I can see her on 09/08/13 at 1:45.  Thanks.

## 2013-08-27 ENCOUNTER — Ambulatory Visit: Payer: Medicare Other | Admitting: Internal Medicine

## 2013-08-30 NOTE — Telephone Encounter (Signed)
Pt son confirmed appointmetn with donna

## 2013-09-08 ENCOUNTER — Encounter: Payer: Self-pay | Admitting: Internal Medicine

## 2013-09-08 ENCOUNTER — Ambulatory Visit (INDEPENDENT_AMBULATORY_CARE_PROVIDER_SITE_OTHER): Payer: Medicare Other | Admitting: Internal Medicine

## 2013-09-08 VITALS — BP 110/70 | HR 64 | Temp 98.2°F | Ht 62.0 in

## 2013-09-08 DIAGNOSIS — I4891 Unspecified atrial fibrillation: Secondary | ICD-10-CM

## 2013-09-08 DIAGNOSIS — F0390 Unspecified dementia without behavioral disturbance: Secondary | ICD-10-CM

## 2013-09-08 DIAGNOSIS — R5381 Other malaise: Secondary | ICD-10-CM

## 2013-09-08 DIAGNOSIS — R0902 Hypoxemia: Secondary | ICD-10-CM

## 2013-09-08 DIAGNOSIS — R9389 Abnormal findings on diagnostic imaging of other specified body structures: Secondary | ICD-10-CM

## 2013-09-08 DIAGNOSIS — E119 Type 2 diabetes mellitus without complications: Secondary | ICD-10-CM

## 2013-09-08 DIAGNOSIS — D649 Anemia, unspecified: Secondary | ICD-10-CM

## 2013-09-08 DIAGNOSIS — N9489 Other specified conditions associated with female genital organs and menstrual cycle: Secondary | ICD-10-CM

## 2013-09-08 DIAGNOSIS — R5383 Other fatigue: Secondary | ICD-10-CM

## 2013-09-08 DIAGNOSIS — E039 Hypothyroidism, unspecified: Secondary | ICD-10-CM

## 2013-09-08 DIAGNOSIS — K279 Peptic ulcer, site unspecified, unspecified as acute or chronic, without hemorrhage or perforation: Secondary | ICD-10-CM

## 2013-09-08 DIAGNOSIS — E785 Hyperlipidemia, unspecified: Secondary | ICD-10-CM

## 2013-09-08 DIAGNOSIS — R918 Other nonspecific abnormal finding of lung field: Secondary | ICD-10-CM

## 2013-09-08 DIAGNOSIS — J449 Chronic obstructive pulmonary disease, unspecified: Secondary | ICD-10-CM

## 2013-09-08 LAB — TSH: TSH: 3 u[IU]/mL (ref 0.35–5.50)

## 2013-09-08 LAB — COMPREHENSIVE METABOLIC PANEL
ALBUMIN: 3.7 g/dL (ref 3.5–5.2)
ALK PHOS: 83 U/L (ref 39–117)
ALT: 12 U/L (ref 0–35)
AST: 18 U/L (ref 0–37)
BILIRUBIN TOTAL: 0.4 mg/dL (ref 0.3–1.2)
BUN: 22 mg/dL (ref 6–23)
CO2: 33 mEq/L — ABNORMAL HIGH (ref 19–32)
Calcium: 9.4 mg/dL (ref 8.4–10.5)
Chloride: 100 mEq/L (ref 96–112)
Creatinine, Ser: 0.8 mg/dL (ref 0.4–1.2)
GFR: 72.74 mL/min (ref >60.00)
GLUCOSE: 235 mg/dL — AB (ref 70–99)
POTASSIUM: 5 meq/L (ref 3.5–5.1)
SODIUM: 138 meq/L (ref 135–145)
TOTAL PROTEIN: 7.1 g/dL (ref 6.0–8.3)

## 2013-09-08 LAB — HEMOGLOBIN A1C: Hgb A1c MFr Bld: 7.6 % — ABNORMAL HIGH (ref 4.6–6.5)

## 2013-09-09 ENCOUNTER — Other Ambulatory Visit: Payer: Self-pay | Admitting: Internal Medicine

## 2013-09-09 ENCOUNTER — Encounter: Payer: Self-pay | Admitting: Internal Medicine

## 2013-09-09 DIAGNOSIS — R05 Cough: Secondary | ICD-10-CM

## 2013-09-09 DIAGNOSIS — R9389 Abnormal findings on diagnostic imaging of other specified body structures: Secondary | ICD-10-CM

## 2013-09-09 DIAGNOSIS — R918 Other nonspecific abnormal finding of lung field: Secondary | ICD-10-CM

## 2013-09-09 DIAGNOSIS — R059 Cough, unspecified: Secondary | ICD-10-CM

## 2013-09-09 NOTE — Assessment & Plan Note (Signed)
Refuses treatment

## 2013-09-09 NOTE — Assessment & Plan Note (Signed)
On thyroid replacement.  Follow tsh.  

## 2013-09-09 NOTE — Assessment & Plan Note (Signed)
CT as outlined.  Saw pulmonary.  Refer to their note for details.  Persistent intermittent cough.  Previous modified barium swallow negative for aspiration (per report).  Never followed through with the sputum cultures (MAI).  Request referral back to pulmonary for further evaluation and treatment recommendations.    

## 2013-09-09 NOTE — Assessment & Plan Note (Signed)
Has a documented history of anemia.  Last hgb normal.

## 2013-09-09 NOTE — Assessment & Plan Note (Signed)
Refuses treatment.  Follow.   

## 2013-09-09 NOTE — Assessment & Plan Note (Signed)
Has home O2.  Follow.  Encouraged her and her family to use.

## 2013-09-09 NOTE — Assessment & Plan Note (Signed)
Apparently has paroxysmal atrial fib.  Sees cardiology.  Appears to be in SR today.  Follow.   

## 2013-09-09 NOTE — Assessment & Plan Note (Signed)
Does not check sugars.  Last a1c 7.2.  Follow.  Low carb diet.  Check metabolic panel and a1c today.

## 2013-09-09 NOTE — Assessment & Plan Note (Signed)
Has known COPD.  Encourage her to use her inhalers.  Unclear how compliant.  Still with persistent intermittent flares of coughing.  Has seen Dr Sherene SiresWert previously.  Request referral back to pulmonary for reevaluation.

## 2013-09-09 NOTE — Assessment & Plan Note (Signed)
Previous positive H. Pylori.  Treated. Currently asymptomatic.   

## 2013-09-09 NOTE — Progress Notes (Signed)
Subjective:    Patient ID: Kendra Sutton, female    DOB: March 13, 1922, 80 y.o.   MRN: [id number]  Cough  80 year old female with past history of COPD on oxygen while she sleeps, peptic ulcer disease, hyperlipidemia, hypothyroidism, diabetes, sick sinus syndrome s/p pacemaker placement, depression and dementia.  She has also been followed by Dr Feliberto Gottron for an endometrial mass.  She comes in today for a scheduled follow up.   She is accompanied by her son and her caretaker.  Has home O2.  Unclear how compliant she is at home with her oxygen and her inhalers.   Recently had a chest CT to f/u abnormal cxr.  See CT report for details.  Concern over possible aspiration vs MAI, etc.  Saw pulmonary.   See Dr Thurston Hole note for details.  He felt no further w/up warranted.  Modified barium swallow negative for aspiration - per report.  She is still having intermittent cough.   Is walking some.  Son reports that she does not move around a lot.  She lives with both of her sons.  Bowels stable.  No urinary burning or vaginal itching or irritation.  Was having issues with sun downing in the hospital.  Family had reported some issues at home as well.  She was started on seroquel 25mg  q hs while in the hospital.  Has been doing well with this.  She is seeing vascular surgery now.  Has a non healing lesion on her toes.      Past Medical History  Diagnosis Date  . COPD (chronic obstructive pulmonary disease)   . Pyuria     chronic  . Peptic ulcer     H. pylori per biopsy 7/03 - treated  . Hyperlipidemia   . Hypothyroidism   . Diabetes     refuses treatment  . Sick sinus syndrome     s/p pacemaker - Dr Darrold Junker  . Depression   . Dementia   . FTT (failure to thrive) in adult   . Atrial fibrillation   . Anemia     Outpatient Encounter Prescriptions as of 09/08/2013  Medication Sig  . aspirin 81 MG tablet Take 81 mg by mouth daily.  . benzonatate (TESSALON) 100 MG capsule Take 1 capsule (100 mg total) by  mouth 3 (three) times daily as needed for cough.  . cephALEXin (KEFLEX) 500 MG capsule Take 1 capsule (500 mg total) by mouth 2 (two) times daily.  . Fluticasone-Salmeterol (ADVAIR) 100-50 MCG/DOSE AEPB Inhale 1 puff into the lungs every 12 (twelve) hours.  . hydrocortisone (ANUSOL-HC) 25 MG suppository Place 1 suppository (25 mg total) rectally 2 (two) times daily.  . hydrocortisone (PROCTOSOL HC) 2.5 % rectal cream Place rectally 2 (two) times daily.  . Infant Care Products (DERMACLOUD) CREA Apply to affected area bid x 7-10 days.  Marland Kitchen levothyroxine (SYNTHROID, LEVOTHROID) 75 MCG tablet Take 1 tablet (75 mcg total) by mouth daily.  . metoprolol (LOPRESSOR) 50 MG tablet TAKE 1 TABLET BY MOUTH TWICE A DAY  . Multiple Vitamin (MULTIVITAMIN) tablet Take 1 tablet by mouth daily.  . mupirocin cream (BACTROBAN) 2 % Apply topically 2 (two) times daily.  . QUEtiapine (SEROQUEL) 25 MG tablet Take 1 tablet (25 mg total) by mouth at bedtime.  . senna (SENOKOT) 176 MG/5ML SYRP One to Two tablets by mouth twice a day  . traZODone (DESYREL) 50 MG tablet Take 50 mg by mouth at bedtime as needed.   . VENTOLIN HFA 108 (90  BASE) MCG/ACT inhaler INHALE 2 PUFFS INTO THE LUNGS EVERY 6 HOURS AS NEEDED FOR WHEEZING    Review of Systems  Respiratory: Positive for cough.   Patient denies any headache, lightheadedness or dizziness.  Does report some persistent cough and congestion.  Intermittent flares.  No acid reflux.  No nausea or vomiting.  No no abdominal pain.  No chest pain or tightness.  No diarrhea.  No fever.   Eating.  Lives with her two sons.  No urinary or vaginal issues.   Seroquel is helping at night.  Seeing vascular surgery for her toes.        Objective:   Physical Exam  Filed Vitals:   09/08/13 1408  BP: 110/70  Pulse: 64  Temp: 98.2 F (7636.338 C)   80 year old female in no acute distress.   HEENT:  Nares- clear.  Oropharynx - without lesions. NECK:  Supple.  Nontender.  No audible bruit.   HEART:  Appears to be regular. LUNGS:  Increased air movement.   No increased wheezing.  Isome increased cough with expiration.     RADIAL PULSE:  Equal bilaterally.  ABDOMEN:  Soft, nontender.  Bowel sounds present and normal.    EXTREMITIES:  Some pedal edema - left foot.   SKIN:  Minimal erythema around toes.  Minimal tenderness.  No redness or swelling extending up her legs. Persistent toe lesion.       Assessment & Plan:  CARDIOVASCULAR.  Stable.  Currently asymptomatic.     I spent 25 minutes with the patient and more than 50% of the time was spent in consultation regarding the above.

## 2013-09-09 NOTE — Assessment & Plan Note (Signed)
CT as outlined.  Saw Dr Sherene SiresWert.  Did not feel any further pulmonary w/up warranted.   Persistent intermittent cough.  See above.  Request referral back to pulmonary for further evaluation and treatment.

## 2013-09-09 NOTE — Assessment & Plan Note (Signed)
Has been seeing Dr Feliberto GottronSchermerhorn.  Apparently has been released.  Caretaker states "nothing more can be done".

## 2013-09-10 ENCOUNTER — Encounter: Payer: Self-pay | Admitting: *Deleted

## 2013-09-10 ENCOUNTER — Telehealth: Payer: Self-pay | Admitting: Internal Medicine

## 2013-09-10 NOTE — Telephone Encounter (Signed)
lmomtcb x1 

## 2013-09-13 ENCOUNTER — Ambulatory Visit: Payer: Self-pay | Admitting: Vascular Surgery

## 2013-09-13 LAB — BASIC METABOLIC PANEL
Anion Gap: 4 — ABNORMAL LOW (ref 7–16)
BUN: 18 mg/dL (ref 7–18)
Calcium, Total: 9 mg/dL (ref 8.5–10.1)
Chloride: 102 mmol/L (ref 98–107)
Co2: 33 mmol/L — ABNORMAL HIGH (ref 21–32)
Creatinine: 0.95 mg/dL (ref 0.60–1.30)
EGFR (African American): >60
EGFR (Non-African Amer.): 53 — ABNORMAL LOW
GLUCOSE: 146 mg/dL — AB (ref 65–99)
Osmolality: 282 (ref 275–301)
Potassium: 4 mmol/L (ref 3.5–5.1)
Sodium: 139 mmol/L (ref 136–145)

## 2013-09-13 NOTE — Telephone Encounter (Signed)
Per last OV note: Continue 02 2lpm 24/7  No further pulmonary follow up is needed  I strongly recommend reconsideration for hospice as it appears whatever the mass is in the uterus has spread to the lungs and she is not a candidate for a lung biopsy as unlikely to survive it and unlikely to make her feel any better.   LMTCB to see why pt needs to be seen. Carron CurieJennifer Kymari Nuon, CMA

## 2013-09-14 NOTE — Telephone Encounter (Signed)
LMTCBx2, see below. Carron CurieJennifer Irbin Fines, CMA

## 2013-09-15 NOTE — Telephone Encounter (Signed)
Returned call. Please call [phone] (son Myrtie Hawkhillip Rubel )

## 2013-09-15 NOTE — Telephone Encounter (Signed)
LMTCBx3 with pt spouse. Aarav Burgett, CMA  

## 2013-09-15 NOTE — Telephone Encounter (Signed)
I spoke with the pt and she states she is not sure what is needed and does not feel like she needs an appt at this time. Carron CurieJennifer Castillo, CMA

## 2013-09-19 ENCOUNTER — Emergency Department: Payer: Self-pay | Admitting: Emergency Medicine

## 2013-09-19 LAB — COMPREHENSIVE METABOLIC PANEL
ALBUMIN: 3.3 g/dL — AB (ref 3.4–5.0)
Alkaline Phosphatase: 91 U/L
Anion Gap: 4 — ABNORMAL LOW (ref 7–16)
BILIRUBIN TOTAL: 0.2 mg/dL (ref 0.2–1.0)
BUN: 15 mg/dL (ref 7–18)
Calcium, Total: 8.5 mg/dL (ref 8.5–10.1)
Chloride: 100 mmol/L (ref 98–107)
Co2: 31 mmol/L (ref 21–32)
Creatinine: 1 mg/dL (ref 0.60–1.30)
EGFR (African American): 58 — ABNORMAL LOW
EGFR (Non-African Amer.): 50 — ABNORMAL LOW
Glucose: 220 mg/dL — ABNORMAL HIGH (ref 65–99)
Osmolality: 278 (ref 275–301)
Potassium: 4.3 mmol/L (ref 3.5–5.1)
SGOT(AST): 21 U/L (ref 15–37)
SGPT (ALT): 15 U/L (ref 12–78)
SODIUM: 135 mmol/L — AB (ref 136–145)
Total Protein: 7.5 g/dL (ref 6.4–8.2)

## 2013-09-19 LAB — CBC WITH DIFFERENTIAL/PLATELET
Basophil #: 0.1 10*3/uL (ref 0.0–0.1)
Basophil %: 0.7 %
Eosinophil #: 0.4 10*3/uL (ref 0.0–0.7)
Eosinophil %: 4.5 %
HCT: 35.3 % (ref 35.0–47.0)
HGB: 11.7 g/dL — AB (ref 12.0–16.0)
LYMPHS ABS: 2.5 10*3/uL (ref 1.0–3.6)
Lymphocyte %: 28.5 %
MCH: 27 pg (ref 26.0–34.0)
MCHC: 33.1 g/dL (ref 32.0–36.0)
MCV: 82 fL (ref 80–100)
Monocyte #: 0.8 x10 3/mm (ref 0.2–0.9)
Monocyte %: 9.4 %
Neutrophil #: 5 10*3/uL (ref 1.4–6.5)
Neutrophil %: 56.9 %
Platelet: 383 10*3/uL (ref 150–440)
RBC: 4.32 10*6/uL (ref 3.80–5.20)
RDW: 16.1 % — ABNORMAL HIGH (ref 11.5–14.5)
WBC: 8.7 10*3/uL (ref 3.6–11.0)

## 2013-09-23 ENCOUNTER — Telehealth: Payer: Self-pay | Admitting: Internal Medicine

## 2013-09-23 NOTE — Telephone Encounter (Signed)
Patient's son called in regarding vomiting. Attempted to call him to triage the patient. Line was busy.

## 2013-09-23 NOTE — Telephone Encounter (Signed)
Pt is on a Abx & reports that she has been vomiting since yesterday. Once yesterday & once today. AV&VS told her that it was probably related to the Abx (Doxycyline 100mg  BID)-She has been on the Abx since Sunday night. Wants to know if she could possibly have a GI bug now. She was seen in the hospital over the weekend for her legs. (records requested). Please advsie

## 2013-09-23 NOTE — Telephone Encounter (Signed)
If legs are looking ok - hold pm dose of abx tonight.  Call in the am with update.  Bland foods.  Fluids.  If persistent vomiting and not keeping anything down - needs reevaluation - back to ER

## 2013-09-23 NOTE — Telephone Encounter (Signed)
Left detailed voicemail on David's voicemail with recommendations & instructions

## 2013-09-24 ENCOUNTER — Telehealth: Payer: Self-pay | Admitting: *Deleted

## 2013-09-24 NOTE — Telephone Encounter (Signed)
The patient's son wants to know if his mother should stay on a bland diet or can she have something else. She is no longer vomiting.

## 2013-09-24 NOTE — Telephone Encounter (Signed)
Left message returning phone call to us

## 2013-09-24 NOTE — Telephone Encounter (Signed)
Left detailed message on voicemail & ER records were already received

## 2013-09-24 NOTE — Telephone Encounter (Signed)
If no further vomiting can advance diet slowly.  If infection present and vascular felt needed doxycycline - can retry the doxycycline as directed.  If persistent problems, will need to stop and be reevaluated.  Need er records.  Thanks.

## 2013-09-27 ENCOUNTER — Ambulatory Visit: Payer: Self-pay | Admitting: Vascular Surgery

## 2013-09-27 ENCOUNTER — Telehealth: Payer: Self-pay | Admitting: Internal Medicine

## 2013-09-27 LAB — BASIC METABOLIC PANEL
ANION GAP: 4 — AB (ref 7–16)
BUN: 16 mg/dL (ref 7–18)
CO2: 32 mmol/L (ref 21–32)
Calcium, Total: 8.8 mg/dL (ref 8.5–10.1)
Chloride: 101 mmol/L (ref 98–107)
Creatinine: 0.84 mg/dL (ref 0.60–1.30)
GLUCOSE: 133 mg/dL — AB (ref 65–99)
Osmolality: 277 (ref 275–301)
POTASSIUM: 4.5 mmol/L (ref 3.5–5.1)
SODIUM: 137 mmol/L (ref 136–145)

## 2013-09-27 NOTE — Telephone Encounter (Signed)
I reviewed ER records.  She was seen for a red foot.  Was started on doxycycline for this.  They had discussed her case with Dr Gilda CreaseSchnier.  Per ER, she was supposed to f/u with Dr Wyn Quakerew in the office last week.  Notify they need a f/u appt with Dr Wyn Quakerew asap.  Needs to be on abx.

## 2013-09-27 NOTE — Telephone Encounter (Signed)
Left detailed message on David's voicemail

## 2013-10-01 ENCOUNTER — Ambulatory Visit (INDEPENDENT_AMBULATORY_CARE_PROVIDER_SITE_OTHER): Payer: Medicare Other | Admitting: Podiatry

## 2013-10-01 VITALS — Resp 20 | Ht 64.0 in | Wt 127.0 lb

## 2013-10-01 DIAGNOSIS — L02619 Cutaneous abscess of unspecified foot: Secondary | ICD-10-CM

## 2013-10-01 DIAGNOSIS — I739 Peripheral vascular disease, unspecified: Secondary | ICD-10-CM

## 2013-10-01 DIAGNOSIS — L03119 Cellulitis of unspecified part of limb: Secondary | ICD-10-CM

## 2013-10-01 NOTE — Progress Notes (Signed)
Subjective:     Patient ID: Kendra EatonKatie Sutton, female   DOB: 02/19/1922, 80 y.o.   MRN: [id number]  HPI patient presents with caregivers today after second attempt at revascularization of left leg was done. She is not a good historian and is noncompliant to try to walk and states all her foot does not hurt she would rather lay in bed   Review of Systems     Objective:   Physical Exam Neurovascular status significantly diminished both feet with erythema in the forefoot left with slight areas of crusted tissue that are not currently draining and appeared to be related to circulatory issues    Assessment:     I do think most of the problem his circulation and I have advised her to go back to the vascular position and at this time I do not see active infection    Plan:     Gave advice above and she'll be seen back by me as needed. Her caregivers understand that she may ultimately require amputation

## 2013-10-08 ENCOUNTER — Encounter: Payer: Self-pay | Admitting: Emergency Medicine

## 2013-10-22 ENCOUNTER — Encounter (HOSPITAL_BASED_OUTPATIENT_CLINIC_OR_DEPARTMENT_OTHER): Payer: Medicare Other | Attending: General Surgery

## 2013-10-22 DIAGNOSIS — J449 Chronic obstructive pulmonary disease, unspecified: Secondary | ICD-10-CM | POA: Insufficient documentation

## 2013-10-22 DIAGNOSIS — I739 Peripheral vascular disease, unspecified: Secondary | ICD-10-CM | POA: Insufficient documentation

## 2013-10-22 DIAGNOSIS — Z79899 Other long term (current) drug therapy: Secondary | ICD-10-CM | POA: Insufficient documentation

## 2013-10-22 DIAGNOSIS — Z7982 Long term (current) use of aspirin: Secondary | ICD-10-CM | POA: Insufficient documentation

## 2013-10-22 DIAGNOSIS — Z950 Presence of cardiac pacemaker: Secondary | ICD-10-CM | POA: Insufficient documentation

## 2013-10-22 DIAGNOSIS — J4489 Other specified chronic obstructive pulmonary disease: Secondary | ICD-10-CM | POA: Insufficient documentation

## 2013-10-22 DIAGNOSIS — I10 Essential (primary) hypertension: Secondary | ICD-10-CM | POA: Insufficient documentation

## 2013-10-22 DIAGNOSIS — E1169 Type 2 diabetes mellitus with other specified complication: Secondary | ICD-10-CM | POA: Insufficient documentation

## 2013-10-22 DIAGNOSIS — L97509 Non-pressure chronic ulcer of other part of unspecified foot with unspecified severity: Secondary | ICD-10-CM | POA: Insufficient documentation

## 2013-10-23 NOTE — Progress Notes (Signed)
[facility]  NAME:  Kendra Sutton, Kendra                ACCOUNT NO.:  [id number]  MEDICAL RECORD NO.:  [id number]      DATE OF BIRTH:  10/14/1921  PHYSICIAN:  Ardath SaxPeter Lui Bellis, M.D.      VISIT DATE:  10/22/2013                                  OFFICE VISIT   She is an 80 year old lady who has type 2 diabetes.  She has hypertension and COPD.  She also has a history of pacemaker and she has had 2 stents placed in her arterial system of the left leg.  Her medicines are multiple including aspirin, Ventolin, Synthroid, Lopressor, Seroquel, and Desyrel.  She when examined was found to have blood pressure of 155/79, pulse 69, temperature 98, she weighs 127 pounds.  She has ulcers on her left foot, a small ulcer on her left 4th toe and left great toe and a larger one about 3 cm in diameter on the anterior aspect of her left foot.  I debrided these of necrotic material, and I feel that in light of her vascular situation, we will try to heal these with putting on Silvadene ointment and dressing them daily.  I do not think she is a candidate for further vascular surgery.  So, her diagnoses is type 2 diabetes, hypertension, peripheral vascular disease, and diabetic ulcers on her left foot, which are ischemic.     Ardath SaxPeter Felicita Nuncio, M.D.     PP/MEDQ  D:  10/22/2013  T:  10/23/2013  Job:  [id number]

## 2013-10-27 ENCOUNTER — Other Ambulatory Visit: Payer: Self-pay | Admitting: Internal Medicine

## 2013-11-12 ENCOUNTER — Other Ambulatory Visit: Payer: Self-pay | Admitting: *Deleted

## 2013-11-12 MED ORDER — FLUTICASONE-SALMETEROL 100-50 MCG/DOSE IN AEPB: 1.0000 | INHALATION_SPRAY | Freq: Two times a day (BID) | RESPIRATORY_TRACT | Status: DC

## 2013-11-15 ENCOUNTER — Ambulatory Visit: Payer: Medicare Other | Admitting: Pulmonary Disease

## 2013-11-19 ENCOUNTER — Ambulatory Visit: Payer: Self-pay | Admitting: Internal Medicine

## 2013-11-20 ENCOUNTER — Encounter: Payer: Self-pay | Admitting: Surgery

## 2013-12-04 LAB — WOUND AEROBIC CULTURE

## 2013-12-08 ENCOUNTER — Encounter: Payer: Self-pay | Admitting: Internal Medicine

## 2013-12-08 ENCOUNTER — Ambulatory Visit (INDEPENDENT_AMBULATORY_CARE_PROVIDER_SITE_OTHER): Payer: Medicare Other | Admitting: Internal Medicine

## 2013-12-08 VITALS — BP 118/70 | HR 61 | Temp 98.4°F | Ht 64.0 in

## 2013-12-08 DIAGNOSIS — E785 Hyperlipidemia, unspecified: Secondary | ICD-10-CM

## 2013-12-08 DIAGNOSIS — R05 Cough: Secondary | ICD-10-CM

## 2013-12-08 DIAGNOSIS — R918 Other nonspecific abnormal finding of lung field: Secondary | ICD-10-CM

## 2013-12-08 DIAGNOSIS — R0902 Hypoxemia: Secondary | ICD-10-CM

## 2013-12-08 DIAGNOSIS — I4891 Unspecified atrial fibrillation: Secondary | ICD-10-CM

## 2013-12-08 DIAGNOSIS — E039 Hypothyroidism, unspecified: Secondary | ICD-10-CM

## 2013-12-08 DIAGNOSIS — R9389 Abnormal findings on diagnostic imaging of other specified body structures: Secondary | ICD-10-CM

## 2013-12-08 DIAGNOSIS — R059 Cough, unspecified: Secondary | ICD-10-CM

## 2013-12-08 DIAGNOSIS — D649 Anemia, unspecified: Secondary | ICD-10-CM

## 2013-12-08 DIAGNOSIS — E119 Type 2 diabetes mellitus without complications: Secondary | ICD-10-CM

## 2013-12-08 DIAGNOSIS — L97509 Non-pressure chronic ulcer of other part of unspecified foot with unspecified severity: Secondary | ICD-10-CM

## 2013-12-08 DIAGNOSIS — N9489 Other specified conditions associated with female genital organs and menstrual cycle: Secondary | ICD-10-CM

## 2013-12-08 DIAGNOSIS — J449 Chronic obstructive pulmonary disease, unspecified: Secondary | ICD-10-CM

## 2013-12-08 DIAGNOSIS — K279 Peptic ulcer, site unspecified, unspecified as acute or chronic, without hemorrhage or perforation: Secondary | ICD-10-CM

## 2013-12-08 MED ORDER — PREDNISONE 10 MG PO TABS: ORAL_TABLET | ORAL | Status: DC

## 2013-12-08 MED ORDER — LEVOFLOXACIN 500 MG PO TABS: 500.0000 mg | ORAL_TABLET | Freq: Every day | ORAL | Status: DC

## 2013-12-08 NOTE — Progress Notes (Signed)
Pre visit review using our clinic review tool, if applicable. No additional management support is needed unless otherwise documented below in the visit note. 

## 2013-12-12 ENCOUNTER — Encounter: Payer: Self-pay | Admitting: Internal Medicine

## 2013-12-12 DIAGNOSIS — L97509 Non-pressure chronic ulcer of other part of unspecified foot with unspecified severity: Secondary | ICD-10-CM | POA: Insufficient documentation

## 2013-12-12 NOTE — Assessment & Plan Note (Signed)
Hx of COPD.   Cough and congestion as outlined.  Treat infection as outlined.  Discussed again with her and her family about the importance of using inhalers regularly.  Use O2.

## 2013-12-12 NOTE — Progress Notes (Signed)
Subjective:    Patient ID: Kendra EatonKatie Sutton, female    DOB: 07/24/1921, 80 y.o.   MRN: [id number]  Cough  80 year old female with past history of COPD on oxygen, peptic ulcer disease, hyperlipidemia, hypothyroidism, diabetes, sick sinus syndrome s/p pacemaker placement, depression and dementia.  She has also been followed by Dr Feliberto GottronSchermerhorn for an endometrial mass.  She comes in today for a scheduled follow up.   She is accompanied by her son and her caretaker.  Has home O2.  Unclear how compliant she is at home with her oxygen and her inhalers.   they report she does not wear her oxygen on a regular basis, but has been using the inhalers more.  Recently had a chest CT to f/u abnormal cxr.  See CT report for details.  Concern over possible aspiration vs MAI, etc.  Saw pulmonary.   See Dr Thurston HoleWert's note for details.  He felt no further w/up warranted.  Modified barium swallow negative for aspiration - per report.  She is still having intermittent cough.  Cough has worsened some recently.  Does not move around a lot.   Is walking some.  She lives with both of her sons.  Bowels stable.  No urinary burning or vaginal itching or irritation.     Past Medical History  Diagnosis Date  . COPD (chronic obstructive pulmonary disease)   . Pyuria     chronic  . Peptic ulcer     H. pylori per biopsy 7/03 - treated  . Hyperlipidemia   . Hypothyroidism   . Diabetes     refuses treatment  . Sick sinus syndrome     s/p pacemaker - Dr Darrold JunkerParaschos  . Depression   . Dementia   . FTT (failure to thrive) in adult   . Atrial fibrillation   . Anemia     Outpatient Encounter Prescriptions as of 12/08/2013  Medication Sig  . aspirin 81 MG tablet Take 81 mg by mouth daily.  . benzonatate (TESSALON) 100 MG capsule Take 1 capsule (100 mg total) by mouth 3 (three) times daily as needed for cough.  . Fluticasone-Salmeterol (ADVAIR) 100-50 MCG/DOSE AEPB Inhale 1 puff into the lungs every 12 (twelve) hours.  . hydrocortisone  (ANUSOL-HC) 25 MG suppository Place 1 suppository (25 mg total) rectally 2 (two) times daily.  . hydrocortisone (PROCTOSOL HC) 2.5 % rectal cream Place rectally 2 (two) times daily.  . Infant Care Products (DERMACLOUD) CREA Apply to affected area bid x 7-10 days.  Marland Kitchen. levothyroxine (SYNTHROID, LEVOTHROID) 75 MCG tablet Take 1 tablet (75 mcg total) by mouth daily.  . metoprolol (LOPRESSOR) 50 MG tablet TAKE 1 TABLET BY MOUTH TWICE A DAY  . Multiple Vitamin (MULTIVITAMIN) tablet Take 1 tablet by mouth daily.  . mupirocin cream (BACTROBAN) 2 % Apply topically 2 (two) times daily.  Marland Kitchen. senna (SENOKOT) 176 MG/5ML SYRP One to Two tablets by mouth twice a day  . traZODone (DESYREL) 50 MG tablet Take 50 mg by mouth at bedtime as needed.   . VENTOLIN HFA 108 (90 BASE) MCG/ACT inhaler INHALE 2 PUFFS INTO THE LUNGS EVERY 6 HOURS AS NEEDED FOR WHEEZING  . [DISCONTINUED] cephALEXin (KEFLEX) 500 MG capsule Take 1 capsule (500 mg total) by mouth 2 (two) times daily.  . [DISCONTINUED] QUEtiapine (SEROQUEL) 25 MG tablet Take 1 tablet (25 mg total) by mouth at bedtime.  Marland Kitchen. levofloxacin (LEVAQUIN) 500 MG tablet Take 1 tablet (500 mg total) by mouth daily.  . predniSONE (DELTASONE)  10 MG tablet Take 6 tablets x 1 day and then decrease by 1/2 tablet per day until down to zero mg.    Review of Systems  Respiratory: Positive for cough.   Patient denies any headache, lightheadedness or dizziness.  Does report some persistent cough and congestion.  Increased recently.  Pt denies feeling sob.  We discussed the need to wear her oxygen.  Intermittent flares.  No acid reflux. No nausea or vomiting.  No no abdominal pain.  No chest pain or tightness.  No diarrhea.  No fever.   Eating.  Lives with her two sons.  No urinary or vaginal issues.  Has seen vascular surgery for her toes. Now being followed at the wound center.  Better.       Objective:   Physical Exam  Filed Vitals:   12/08/13 1026  BP: 118/70  Pulse: 61  Temp:  98.4 F (63.27 C)   80 year old female in no acute distress.   HEENT:  Nares- clear.  Oropharynx - without lesions. NECK:  Supple.  Nontender.  No audible bruit.  HEART:  Appears to be regular. LUNGS:  Some decreased breath sounds.  Some increased congestion, which cleared some with coughing.  Some increased cough with expiration.     RADIAL PULSE:  Equal bilaterally.  ABDOMEN:  Soft, nontender.  Bowel sounds present and normal.    EXTREMITIES:  Some pedal edema - left foot greater.    SKIN:  Minimal erythema around toes.  Minimal tenderness.  No redness or swelling extending up her legs. Persistent toe lesion/ulceration - improved.       Assessment & Plan:  CARDIOVASCULAR.  Stable.      I spent 25 minutes with the patient and more than 50% of the time was spent in consultation regarding the above.

## 2013-12-12 NOTE — Assessment & Plan Note (Addendum)
Has seen Dr Feliberto Gottron in the past.   Apparently has been released.  Caretaker states "nothing more to be done".

## 2013-12-12 NOTE — Assessment & Plan Note (Signed)
Was seeing vascular surgery.  Now being followed at the wound center.  Better.

## 2013-12-12 NOTE — Assessment & Plan Note (Signed)
Does not check sugars.  Last a1c 7.2.  Follow.  Low carb diet.  Follow metabolic panel and a1c.

## 2013-12-12 NOTE — Assessment & Plan Note (Signed)
Has known COPD.  Encourage her to use her inhalers.  Unclear how compliant.  Still with persistent intermittent flares of coughing.  Worsened recently.  Will treat with levaquin as directed.  Also prednisone taper.  Follow closely. Has seen Dr Sherene Sires previously.  Request referral back to pulmonary for reevaluation after our discussion.  Noncompliant with her oxygen.  Recheck pulse ox today 91-92%.  Discussed the importance of wearing her oxygen.  Will refer to Dr Kendrick Fries.  Family desires to stay in [place].  Wants to hold on cxr at this time.  Further w/up and treatment per pulmonary.

## 2013-12-12 NOTE — Assessment & Plan Note (Signed)
Refuses treatment.  Follow.

## 2013-12-12 NOTE — Assessment & Plan Note (Signed)
Apparently has paroxysmal atrial fib.  Sees cardiology.  Appears to be in SR today.  Follow.

## 2013-12-12 NOTE — Assessment & Plan Note (Signed)
CT as outlined.  Saw pulmonary.  Refer to their note for details.  Persistent intermittent cough.  Previous modified barium swallow negative for aspiration (per report).  Never followed through with the sputum cultures (MAI).  Request referral back to pulmonary for further evaluation and treatment recommendations.

## 2013-12-12 NOTE — Assessment & Plan Note (Signed)
CT as outlined.  Barium swallow negative for aspiration.  Saw Dr Sherene Sires.  Did not feel any further pulmonary w/up warranted.   Persistent intermittent cough.  Worse today.  Treat overlying infection.  See above.  Request referral back to pulmonary for further evaluation and treatment.

## 2013-12-12 NOTE — Assessment & Plan Note (Signed)
Previous positive H. Pylori.  Treated. Currently asymptomatic.

## 2013-12-12 NOTE — Assessment & Plan Note (Signed)
On thyroid replacement.  Follow tsh.  

## 2013-12-12 NOTE — Assessment & Plan Note (Signed)
Has home O2.  Noncompliant.  Follow.  Encouraged her and her family to use.

## 2013-12-12 NOTE — Assessment & Plan Note (Signed)
Has a documented history of anemia.  Last hgb normal.  Follow.

## 2013-12-13 ENCOUNTER — Encounter: Payer: Self-pay | Admitting: Surgery

## 2013-12-13 ENCOUNTER — Ambulatory Visit: Payer: Self-pay | Admitting: Surgery

## 2013-12-17 ENCOUNTER — Encounter: Payer: Self-pay | Admitting: Surgery

## 2014-01-01 ENCOUNTER — Emergency Department: Payer: Self-pay | Admitting: Emergency Medicine

## 2014-01-01 LAB — COMPREHENSIVE METABOLIC PANEL
ALT: 15 U/L (ref 12–78)
ANION GAP: 1 — AB (ref 7–16)
AST: 18 U/L (ref 15–37)
Albumin: 3.1 g/dL — ABNORMAL LOW (ref 3.4–5.0)
Alkaline Phosphatase: 77 U/L
BILIRUBIN TOTAL: 0.3 mg/dL (ref 0.2–1.0)
BUN: 14 mg/dL (ref 7–18)
CALCIUM: 8.8 mg/dL (ref 8.5–10.1)
CHLORIDE: 102 mmol/L (ref 98–107)
CREATININE: 0.88 mg/dL (ref 0.60–1.30)
Co2: 34 mmol/L — ABNORMAL HIGH (ref 21–32)
GFR CALC NON AF AMER: 58 — AB
GLUCOSE: 146 mg/dL — AB (ref 65–99)
Osmolality: 277 (ref 275–301)
Potassium: 3.9 mmol/L (ref 3.5–5.1)
SODIUM: 137 mmol/L (ref 136–145)
Total Protein: 6.9 g/dL (ref 6.4–8.2)

## 2014-01-01 LAB — CBC
HCT: 35.9 % (ref 35.0–47.0)
HGB: 11.3 g/dL — AB (ref 12.0–16.0)
MCH: 25.1 pg — ABNORMAL LOW (ref 26.0–34.0)
MCHC: 31.5 g/dL — ABNORMAL LOW (ref 32.0–36.0)
MCV: 80 fL (ref 80–100)
PLATELETS: 352 10*3/uL (ref 150–440)
RBC: 4.5 10*6/uL (ref 3.80–5.20)
RDW: 17.4 % — ABNORMAL HIGH (ref 11.5–14.5)
WBC: 9.3 10*3/uL (ref 3.6–11.0)

## 2014-01-01 LAB — URINALYSIS, COMPLETE
BACTERIA: NONE SEEN
BLOOD: NEGATIVE
Bilirubin,UR: NEGATIVE
Glucose,UR: NEGATIVE mg/dL (ref 0–75)
KETONE: NEGATIVE
Leukocyte Esterase: NEGATIVE
NITRITE: NEGATIVE
PH: 5 (ref 4.5–8.0)
Protein: NEGATIVE
RBC,UR: NONE SEEN /HPF (ref 0–5)
SPECIFIC GRAVITY: 1.02 (ref 1.003–1.030)
SQUAMOUS EPITHELIAL: NONE SEEN

## 2014-01-03 ENCOUNTER — Ambulatory Visit: Payer: Self-pay | Admitting: Vascular Surgery

## 2014-01-03 ENCOUNTER — Ambulatory Visit (INDEPENDENT_AMBULATORY_CARE_PROVIDER_SITE_OTHER): Payer: Medicare Other | Admitting: Adult Health

## 2014-01-03 ENCOUNTER — Encounter: Payer: Self-pay | Admitting: Adult Health

## 2014-01-03 VITALS — BP 124/66 | HR 87 | Temp 98.7°F | Resp 14 | Wt 117.5 lb

## 2014-01-03 DIAGNOSIS — R410 Disorientation, unspecified: Secondary | ICD-10-CM

## 2014-01-03 DIAGNOSIS — R197 Diarrhea, unspecified: Secondary | ICD-10-CM

## 2014-01-03 DIAGNOSIS — F29 Unspecified psychosis not due to a substance or known physiological condition: Secondary | ICD-10-CM

## 2014-01-03 LAB — BUN: BUN: 14 mg/dL (ref 7–18)

## 2014-01-03 LAB — POCT URINALYSIS DIPSTICK
BILIRUBIN UA: NEGATIVE
Blood, UA: NEGATIVE
GLUCOSE UA: NEGATIVE
Ketones, UA: NEGATIVE
NITRITE UA: NEGATIVE
Protein, UA: 30
Urobilinogen, UA: 0.2
pH, UA: 5.5

## 2014-01-03 LAB — CREATININE, SERUM
Creatinine: 0.86 mg/dL (ref 0.60–1.30)
EGFR (Non-African Amer.): 60 — ABNORMAL LOW

## 2014-01-03 LAB — CBC WITH DIFFERENTIAL/PLATELET
Basophils Absolute: 0 10*3/uL (ref 0.0–0.1)
Basophils Relative: 0.4 % (ref 0.0–3.0)
EOS PCT: 4.1 % (ref 0.0–5.0)
Eosinophils Absolute: 0.3 10*3/uL (ref 0.0–0.7)
HCT: 35.6 % — ABNORMAL LOW (ref 36.0–46.0)
Hemoglobin: 11.1 g/dL — ABNORMAL LOW (ref 12.0–15.0)
Lymphocytes Relative: 32.4 % (ref 12.0–46.0)
Lymphs Abs: 2.7 10*3/uL (ref 0.7–4.0)
MCHC: 31.4 g/dL (ref 30.0–36.0)
MCV: 80 fl (ref 78.0–100.0)
Monocytes Absolute: 0.7 10*3/uL (ref 0.1–1.0)
Monocytes Relative: 8 % (ref 3.0–12.0)
NEUTROS PCT: 55.1 % (ref 43.0–77.0)
Neutro Abs: 4.5 10*3/uL (ref 1.4–7.7)
PLATELETS: 402 10*3/uL — AB (ref 150.0–400.0)
RBC: 4.45 Mil/uL (ref 3.87–5.11)
RDW: 18 % — ABNORMAL HIGH (ref 11.5–15.5)
WBC: 8.2 10*3/uL (ref 4.0–10.5)

## 2014-01-03 LAB — BASIC METABOLIC PANEL
BUN: 15 mg/dL (ref 6–23)
CHLORIDE: 100 meq/L (ref 96–112)
CO2: 34 mEq/L — ABNORMAL HIGH (ref 19–32)
Calcium: 9 mg/dL (ref 8.4–10.5)
Creatinine, Ser: 0.8 mg/dL (ref 0.4–1.2)
GFR: 68.66 mL/min (ref >60.00)
Glucose, Bld: 134 mg/dL — ABNORMAL HIGH (ref 70–99)
POTASSIUM: 3.9 meq/L (ref 3.5–5.1)
Sodium: 141 mEq/L (ref 135–145)

## 2014-01-03 MED ORDER — CIPROFLOXACIN HCL 250 MG PO TABS: 250.0000 mg | ORAL_TABLET | Freq: Two times a day (BID) | ORAL | Status: DC

## 2014-01-03 NOTE — Patient Instructions (Signed)
  Please have labs drawn prior to leaving.  Cipro 250 mg twice a day for 5 days.  We will contact you with results once they are available.

## 2014-01-03 NOTE — Progress Notes (Signed)
Subjective:    Patient ID: Kendra EatonKatie Sutton, female    DOB: 10/15/1921, 80 y.o.   MRN: [id number]  HPI  80 yo caucasian female with multiple medical problems including endometrial mass (cancer per family report), COPD on oxygen, HLD, PUD, DM, adult FTT who presents today for follow up after being seen in the emergency room at [facility] on 6/20 for diarrhea. She is present today with her son and caregiver who provide majority of the history. Had several bowel movements on 6/20 so caregiver was concerned for dehydration. Labs were reported to be normal. Discharged from the ED with instructions to hydrate with electrolyte solution. Pt states she feels "not too good, feel like I can lay down and sleep all day." Was scheduled for surgery to unblock multiple veins in the left leg. Has poor healing wound on left leg secondary to diabetes. Physicians did not want to put her under anesthesia because of her age and chances of increased dehydration. Wanted to continue to rehydrate. Rescheduling of surgery appointment is pending.   Caregiver and family concerned about recent development of hallucinations.  Previously referred to hospice of [facility], but not currently enrolled or receiving care.    Past Medical History  Diagnosis Date  . COPD (chronic obstructive pulmonary disease)   . Pyuria     chronic  . Peptic ulcer     H. pylori per biopsy 7/03 - treated  . Hyperlipidemia   . Hypothyroidism   . Diabetes     refuses treatment  . Sick sinus syndrome     s/p pacemaker - Dr Darrold JunkerParaschos  . Depression   . Dementia   . FTT (failure to thrive) in adult   . Atrial fibrillation   . Anemia     Current Outpatient Prescriptions on File Prior to Visit  Medication Sig Dispense Refill  . aspirin 81 MG tablet Take 81 mg by mouth daily.      . Fluticasone-Salmeterol (ADVAIR) 100-50 MCG/DOSE AEPB Inhale 1 puff into the lungs every 12 (twelve) hours.  60 each  5  . hydrocortisone (ANUSOL-HC) 25 MG suppository  Place 1 suppository (25 mg total) rectally 2 (two) times daily.  28 suppository  0  . hydrocortisone (PROCTOSOL HC) 2.5 % rectal cream Place rectally 2 (two) times daily.  30 g  0  . Infant Care Products (DERMACLOUD) CREA Apply to affected area bid x 7-10 days.  430 g  0  . levothyroxine (SYNTHROID, LEVOTHROID) 75 MCG tablet Take 1 tablet (75 mcg total) by mouth daily.  30 tablet  5  . metoprolol (LOPRESSOR) 50 MG tablet TAKE 1 TABLET BY MOUTH TWICE A DAY  180 tablet  1  . Multiple Vitamin (MULTIVITAMIN) tablet Take 1 tablet by mouth daily.      . mupirocin cream (BACTROBAN) 2 % Apply topically 2 (two) times daily.  15 g  0  . senna (SENOKOT) 176 MG/5ML SYRP One to Two tablets by mouth twice a day      . traZODone (DESYREL) 50 MG tablet Take 50 mg by mouth at bedtime as needed.       . VENTOLIN HFA 108 (90 BASE) MCG/ACT inhaler INHALE 2 PUFFS INTO THE LUNGS EVERY 6 HOURS AS NEEDED FOR WHEEZING  18 each  2   No current facility-administered medications on file prior to visit.     Review of Systems  Constitutional: Negative for fever and chills.  Respiratory: Positive for cough. Negative for chest tightness and shortness of breath.  Family reports that pt is not using oxygen as she should  Cardiovascular: Negative for chest pain.  Gastrointestinal: Positive for diarrhea.  Psychiatric/Behavioral: Positive for hallucinations and confusion.  All other systems reviewed and are negative.      Objective:  BP 124/66  Pulse 87  Temp(Src) 98.7 F (37.1 C) (Oral)  Resp 14  Wt 117 lb 8 oz (53.298 kg)  SpO2 90%   Physical Exam  Constitutional: No distress.  Chronically ill 80 yo female seated in wheelchair appears thin, but neatly dressed. Has difficulty hearing but answers questions appropriately. Caregiver frequently interrupts.  HENT:  Head: Normocephalic and atraumatic.  Right Ear: Decreased hearing is noted.  Left Ear: Decreased hearing is noted.  Cardiovascular: Normal rate,  regular rhythm and normal heart sounds.  Exam reveals no gallop and no friction rub.   No murmur heard. Pulmonary/Chest: Effort normal. She has wheezes.  Musculoskeletal:  Wheelchair bound. Able to stand but requires 2 person assist  Neurological: She is alert.  Skin: Skin is warm and dry.  Psychiatric: She has a normal mood and affect. Her behavior is normal. Thought content normal.      Assessment & Plan:   1. Diarrhea Will check labs and stool for culture, O&P, c diff. Encouraged rehydration. - CBC with Differential - Basic metabolic panel - Stool culture - Ova and parasite examination - Clostridium difficile EIA  2. Confusion May be secondary to infectious process, dehydration or progressing dementia. Will check labs as above. UA shows some leukocytes. Send urine for culture. Treat empirically with cipro bid x 5 day. Note: pt has been under hospice care in the past. She would certainly qualify for services again given her multiple comorbidities including possible endometrial cancer.  - POCT urinalysis dipstick - Urine culture

## 2014-01-03 NOTE — Progress Notes (Signed)
Pre visit review using our clinic review tool, if applicable. No additional management support is needed unless otherwise documented below in the visit note. 

## 2014-01-04 ENCOUNTER — Other Ambulatory Visit: Payer: Self-pay | Admitting: Internal Medicine

## 2014-01-06 ENCOUNTER — Other Ambulatory Visit: Payer: Self-pay | Admitting: Adult Health

## 2014-01-06 DIAGNOSIS — N390 Urinary tract infection, site not specified: Secondary | ICD-10-CM

## 2014-01-06 LAB — URINE CULTURE: Colony Count: >=100000

## 2014-01-07 NOTE — Progress Notes (Signed)
Spoke with patient's son, states urinary symptoms have improved, nothing further needed at this time, advised to have pt complete Cipro. Verbalized understanding.

## 2014-01-07 NOTE — Progress Notes (Signed)
One option would be to see if you could find out what specific reaction she had to pcn and depending on reaction, could try a cephalosporin.  Also, even though states resistant to cipro, cipro concentrates very well in the urine and sometimes can still treat infection (even if states resistant).  Would recommend having nurse call and check and see if symptoms improved.  I am ok if you feel ID consult warranted.

## 2014-01-07 NOTE — Progress Notes (Signed)
Becky see Dr. Roby LoftsScott's note below

## 2014-01-11 LAB — WOUND AEROBIC CULTURE

## 2014-01-12 ENCOUNTER — Ambulatory Visit: Payer: Self-pay | Admitting: Internal Medicine

## 2014-01-12 ENCOUNTER — Encounter: Payer: Self-pay | Admitting: Surgery

## 2014-01-12 ENCOUNTER — Ambulatory Visit
Admit: 2014-01-12 | Disposition: A | Discharge status: Home / Self Care | Payer: Self-pay | Attending: Nurse Practitioner | Admitting: Nurse Practitioner

## 2014-01-12 ENCOUNTER — Ambulatory Visit: Payer: Self-pay | Admitting: Vascular Surgery

## 2014-01-12 LAB — BASIC METABOLIC PANEL
Anion Gap: 4 — ABNORMAL LOW (ref 7–16)
BUN: 16 mg/dL (ref 7–18)
Calcium, Total: 8.9 mg/dL (ref 8.5–10.1)
Chloride: 101 mmol/L (ref 98–107)
Co2: 32 mmol/L (ref 21–32)
Creatinine: 0.85 mg/dL (ref 0.60–1.30)
EGFR (African American): >60
Glucose: 124 mg/dL — ABNORMAL HIGH (ref 65–99)
Osmolality: 276 (ref 275–301)
Potassium: 4 mmol/L (ref 3.5–5.1)
SODIUM: 137 mmol/L (ref 136–145)

## 2014-01-19 ENCOUNTER — Ambulatory Visit: Payer: Medicare Other | Admitting: Internal Medicine

## 2014-01-24 ENCOUNTER — Other Ambulatory Visit: Payer: Self-pay | Admitting: Adult Health

## 2014-01-24 ENCOUNTER — Ambulatory Visit (INDEPENDENT_AMBULATORY_CARE_PROVIDER_SITE_OTHER): Payer: Medicare Other | Admitting: Pulmonary Disease

## 2014-01-24 ENCOUNTER — Telehealth: Payer: Self-pay | Admitting: Internal Medicine

## 2014-01-24 ENCOUNTER — Encounter: Payer: Self-pay | Admitting: Pulmonary Disease

## 2014-01-24 ENCOUNTER — Encounter (INDEPENDENT_AMBULATORY_CARE_PROVIDER_SITE_OTHER): Payer: Self-pay

## 2014-01-24 VITALS — BP 106/58 | HR 65 | Temp 98.2°F | Resp 14 | Wt 114.0 lb

## 2014-01-24 DIAGNOSIS — R05 Cough: Secondary | ICD-10-CM

## 2014-01-24 DIAGNOSIS — R0902 Hypoxemia: Secondary | ICD-10-CM

## 2014-01-24 DIAGNOSIS — R1310 Dysphagia, unspecified: Secondary | ICD-10-CM

## 2014-01-24 DIAGNOSIS — J411 Mucopurulent chronic bronchitis: Secondary | ICD-10-CM

## 2014-01-24 DIAGNOSIS — R059 Cough, unspecified: Secondary | ICD-10-CM

## 2014-01-24 MED ORDER — IPRATROPIUM-ALBUTEROL 0.5-2.5 (3) MG/3ML IN SOLN: 3.0000 mL | Freq: Four times a day (QID) | RESPIRATORY_TRACT | Status: AC

## 2014-01-24 NOTE — Assessment & Plan Note (Signed)
I have no pulmonary function testing to go on. I agree with my partner Dr. Sherene SiresWert that this is most likely chronic asthma given that she never smoked cigarettes.  I do not think that she is capable of using an inhaler of any kind successfully do to what appears to be fairly advanced dementia.  Plan: -Stop Advair -Use DuoNeb 4 times a day  -Continue albuterol on a when necessary basis only

## 2014-01-24 NOTE — Assessment & Plan Note (Signed)
I reviewed her CT scan of the chest from several years ago and in addition to several small cavitary nodules it is very clear that she has bronchiectasis and mucous plugging throughout the right lower lobe as well as scattered throughout some of the other lobes. Notably, her upper lobes bilaterally are very clear. Her esophagus was noted to be patulous proximally.  Despite the report of a normal barium swallow in the past, this lady has dysphagia and chronic aspiration.  Plan: -Refer for modified barium swallow and speech therapy teaching for swallowing

## 2014-01-24 NOTE — Telephone Encounter (Signed)
Called to let us know that patient didn't come to her appointment with Dr. Bonnita Nasutiambell on January 19, 2014.

## 2014-01-24 NOTE — Patient Instructions (Addendum)
We will arrange a modified [facility] swallow at [facility] Stop Advair Use the duoneb nebulized medicine four times a day Use albuterol 2 puffs every four hours as needed for shortness of breath We will see you back in one month

## 2014-01-24 NOTE — Progress Notes (Signed)
Subjective:    Patient ID: Kendra EatonKatie Sutton, female    DOB: 08/26/1921, 80 y.o.   MRN: [id number]  HPI  This is an 80 year old female with dementia who comes to my clinic today for evaluation of cough and shortness of breath. She is a never smoker but she formally worked in a history [employer] son says that she is to come home "covered in length". For many years she has been told that she has COPD and she has used Advair to try to treat this. Specifically, she says that the Advair does not seem to help anymore and she continues to have shortness of breath and cough. At times during the history she will give me a fairly lucid answer but then frequently she says "I don't know the need to ask my son ". It sounds like she has shortness of breath whenever she exerts herself and she stopped using her oxygen. The cough is prominent often when she eaten when she lies flat. She has not had trouble sleeping at night and she is not up coughing all night long. She occasionally chokes on food and this will lead to frequent coughing. She does not vomit frequently. She does not cough up mucus that often. Typically the cough is just dry. She says  "I cough just to cough ". When asked for further clarification she says well I cough and nothing ever comes up. She takes albuterol twice a day as well as Advair twice a day. She says that any benefit she may receive from either of these only last for a few minutes and goes away quickly. Further history cannot be obtained do to the patient's dementia.  Past Medical History  Diagnosis Date  . COPD (chronic obstructive pulmonary disease)   . Pyuria     chronic  . Peptic ulcer     H. pylori per biopsy 7/03 - treated  . Hyperlipidemia   . Hypothyroidism   . Diabetes     refuses treatment  . Sick sinus syndrome     s/p pacemaker - Dr Darrold JunkerParaschos  . Depression   . Dementia   . FTT (failure to thrive) in adult   . Atrial fibrillation   . Anemia      Family History    Problem Relation Age of Onset  . COPD Father   . Kidney cancer Mother     renal cell carcinoma     History   Social History  . Marital Status: Widowed    Spouse Name: N/A    Number of Children: 2  . Years of Education: N/A   Occupational History  . Not on file.   Social History Main Topics  . Smoking status: Never Smoker   . Smokeless tobacco: Never Used  . Alcohol Use: No  . Drug Use: No  . Sexual Activity: Not on file   Other Topics Concern  . Not on file   Social History Narrative  . No narrative on file     Allergies  Allergen Reactions  . Penicillins   . Sulfa Antibiotics      Outpatient Prescriptions Prior to Visit  Medication Sig Dispense Refill  . aspirin 81 MG tablet Take 81 mg by mouth daily.      . Fluticasone-Salmeterol (ADVAIR) 100-50 MCG/DOSE AEPB Inhale 1 puff into the lungs every 12 (twelve) hours.  60 each  5  . hydrocortisone (ANUSOL-HC) 25 MG suppository Place 1 suppository (25 mg total) rectally 2 (two) times daily.  28 suppository  0  . hydrocortisone (PROCTOSOL HC) 2.5 % rectal cream Place rectally 2 (two) times daily.  30 g  0  . Infant Care Products (DERMACLOUD) CREA Apply to affected area bid x 7-10 days.  430 g  0  . levothyroxine (SYNTHROID, LEVOTHROID) 75 MCG tablet Take 1 tablet (75 mcg total) by mouth daily.  30 tablet  5  . metoprolol (LOPRESSOR) 50 MG tablet TAKE 1 TABLET BY MOUTH TWICE A DAY  180 tablet  1  . Multiple Vitamin (MULTIVITAMIN) tablet Take 1 tablet by mouth daily.      . mupirocin cream (BACTROBAN) 2 % Apply topically 2 (two) times daily.  15 g  0  . nystatin-triamcinolone (MYCOLOG II) cream Apply 1 application topically 3 (three) times daily.      Marland Kitchen senna (SENOKOT) 176 MG/5ML SYRP One to Two tablets by mouth twice a day      . traZODone (DESYREL) 50 MG tablet Take 50 mg by mouth at bedtime as needed.       . VENTOLIN HFA 108 (90 BASE) MCG/ACT inhaler INHALE 2 PUFFS INTO THE LUNGS EVERY 6 HOURS AS NEEDED FOR WHEEZING   18 each  2   No facility-administered medications prior to visit.      Review of Systems  Constitutional: Negative for fever and unexpected weight change.  HENT: Negative for congestion, dental problem, ear pain, nosebleeds, postnasal drip, rhinorrhea, sinus pressure, sneezing, sore throat and trouble swallowing.   Eyes: Negative for redness and itching.  Respiratory: Positive for cough and shortness of breath. Negative for chest tightness and wheezing.   Cardiovascular: Negative for palpitations and leg swelling.  Gastrointestinal: Negative for nausea and vomiting.  Genitourinary: Negative for dysuria.  Musculoskeletal: Negative for joint swelling.  Skin: Negative for rash.  Neurological: Negative for headaches.  Hematological: Does not bruise/bleed easily.  Psychiatric/Behavioral: Negative for dysphoric mood. The patient is not nervous/anxious.        Objective:   Physical Exam Filed Vitals:   01/24/14 1510  BP: 106/58  Pulse: 65  Temp: 98.2 F (36.8 C)  TempSrc: Oral  Resp: 14  Weight: 114 lb (51.71 kg)  SpO2: 93%   RA  Gen: chronically ill appearing, no acute distress HEENT: NCAT, PERRL, EOMi, OP clear, neck supple without masses PULM: Wheezing and crackles throughout the lungs bilaterally CV: RRR, no mgr, no JVD AB: BS+, soft, nontender, no hsm Ext: warm, no edema, no clubbing, no cyanosis Derm: no rash or skin breakdown Neuro: Awake, answers some questions without difficulty very inattentive, CN II-XII intact, strength 5/5 in all 4 extremities  2013 CT chest reviewed> upper lobe parenchyma normal bilaterally, there is mucus plugging, cavitary nodules, and airway thickening consistent with bronchiectasis most prominent in the right lower lobe and scattered in the lingula as well as left lower lobe      Assessment & Plan:   COPD (chronic obstructive pulmonary disease) I have no pulmonary function testing to go on. I agree with my partner Dr. Sherene Sires that this is  most likely chronic asthma given that she never smoked cigarettes.  I do not think that she is capable of using an inhaler of any kind successfully do to what appears to be fairly advanced dementia.  Plan: -Stop Advair -Use DuoNeb 4 times a day  -Continue albuterol on a when necessary basis only  Cough I reviewed her CT scan of the chest from several years ago and in addition to several small cavitary nodules  it is very clear that she has bronchiectasis and mucous plugging throughout the right lower lobe as well as scattered throughout some of the other lobes. Notably, her upper lobes bilaterally are very clear. Her esophagus was noted to be patulous proximally.  Despite the report of a normal barium swallow in the past, this lady has dysphagia and chronic aspiration.  Plan: -Refer for modified barium swallow and speech therapy teaching for swallowing    Updated Medication List Outpatient Encounter Prescriptions as of 01/24/2014  Medication Sig  . aspirin 81 MG tablet Take 81 mg by mouth daily.  . Fluticasone-Salmeterol (ADVAIR) 100-50 MCG/DOSE AEPB Inhale 1 puff into the lungs every 12 (twelve) hours.  . hydrocortisone (ANUSOL-HC) 25 MG suppository Place 1 suppository (25 mg total) rectally 2 (two) times daily.  . hydrocortisone (PROCTOSOL HC) 2.5 % rectal cream Place rectally 2 (two) times daily.  . Infant Care Products (DERMACLOUD) CREA Apply to affected area bid x 7-10 days.  Marland Kitchen levothyroxine (SYNTHROID, LEVOTHROID) 75 MCG tablet Take 1 tablet (75 mcg total) by mouth daily.  . metoprolol (LOPRESSOR) 50 MG tablet TAKE 1 TABLET BY MOUTH TWICE A DAY  . Multiple Vitamin (MULTIVITAMIN) tablet Take 1 tablet by mouth daily.  . mupirocin cream (BACTROBAN) 2 % Apply topically 2 (two) times daily.  Marland Kitchen nystatin-triamcinolone (MYCOLOG II) cream Apply 1 application topically 3 (three) times daily.  Marland Kitchen senna (SENOKOT) 176 MG/5ML SYRP One to Two tablets by mouth twice a day  . traZODone (DESYREL)  50 MG tablet Take 50 mg by mouth at bedtime as needed.   . VENTOLIN HFA 108 (90 BASE) MCG/ACT inhaler INHALE 2 PUFFS INTO THE LUNGS EVERY 6 HOURS AS NEEDED FOR WHEEZING

## 2014-01-25 LAB — C. DIFFICILE GDH AND TOXIN A/B
C. DIFF TOXIN A/B: NOT DETECTED
C. DIFFICILE GDH: NOT DETECTED

## 2014-01-25 LAB — OVA AND PARASITE EXAMINATION: OP: NONE SEEN

## 2014-01-25 NOTE — Addendum Note (Signed)
Addended by: Velvet BatheAULFIELD, ASHLEY L on: 01/25/2014 09:34 AM   Modules accepted: Orders

## 2014-01-25 NOTE — Telephone Encounter (Signed)
FYI: Patient no showed appointment with Dr. Bonnita Nasutiambell on 01/19/14.

## 2014-01-27 ENCOUNTER — Encounter: Payer: Self-pay | Admitting: *Deleted

## 2014-01-28 LAB — STOOL CULTURE

## 2014-02-03 LAB — WOUND CULTURE

## 2014-02-09 ENCOUNTER — Ambulatory Visit: Payer: Medicare Other | Admitting: Adult Health

## 2014-02-09 ENCOUNTER — Telehealth: Payer: Self-pay | Admitting: *Deleted

## 2014-02-09 ENCOUNTER — Inpatient Hospital Stay: Payer: Self-pay | Admitting: Internal Medicine

## 2014-02-09 LAB — URINALYSIS, COMPLETE
BLOOD: NEGATIVE
Bacteria: NONE SEEN
Bilirubin,UR: NEGATIVE
GLUCOSE, UR: NEGATIVE mg/dL (ref 0–75)
Hyaline Cast: 5
KETONE: NEGATIVE
Leukocyte Esterase: NEGATIVE
NITRITE: NEGATIVE
Ph: 5 (ref 4.5–8.0)
Protein: NEGATIVE
RBC,UR: 4 /HPF (ref 0–5)
SPECIFIC GRAVITY: 1.02 (ref 1.003–1.030)
Squamous Epithelial: NONE SEEN
WBC UR: NONE SEEN /HPF (ref 0–5)

## 2014-02-09 LAB — COMPREHENSIVE METABOLIC PANEL
Albumin: 3.3 g/dL — ABNORMAL LOW (ref 3.4–5.0)
Alkaline Phosphatase: 83 U/L
Anion Gap: 7 (ref 7–16)
BUN: 22 mg/dL — AB (ref 7–18)
Bilirubin,Total: 0.3 mg/dL (ref 0.2–1.0)
CHLORIDE: 101 mmol/L (ref 98–107)
Calcium, Total: 8.6 mg/dL (ref 8.5–10.1)
Co2: 30 mmol/L (ref 21–32)
Creatinine: 1.11 mg/dL (ref 0.60–1.30)
EGFR (African American): 51 — ABNORMAL LOW
EGFR (Non-African Amer.): 44 — ABNORMAL LOW
GLUCOSE: 227 mg/dL — AB (ref 65–99)
Osmolality: 286 (ref 275–301)
POTASSIUM: 4.3 mmol/L (ref 3.5–5.1)
SGOT(AST): 21 U/L (ref 15–37)
SGPT (ALT): 29 U/L
Sodium: 138 mmol/L (ref 136–145)
TOTAL PROTEIN: 7.5 g/dL (ref 6.4–8.2)

## 2014-02-09 LAB — CBC WITH DIFFERENTIAL/PLATELET
Basophil #: 0.1 10*3/uL (ref 0.0–0.1)
Basophil %: 0.6 %
EOS PCT: 4.7 %
Eosinophil #: 0.5 10*3/uL (ref 0.0–0.7)
HCT: 39.4 % (ref 35.0–47.0)
HGB: 12.2 g/dL (ref 12.0–16.0)
Lymphocyte #: 3.6 10*3/uL (ref 1.0–3.6)
Lymphocyte %: 31.6 %
MCH: 24.8 pg — ABNORMAL LOW (ref 26.0–34.0)
MCHC: 30.9 g/dL — AB (ref 32.0–36.0)
MCV: 80 fL (ref 80–100)
MONO ABS: 0.9 x10 3/mm (ref 0.2–0.9)
Monocyte %: 7.5 %
Neutrophil #: 6.3 10*3/uL (ref 1.4–6.5)
Neutrophil %: 55.6 %
PLATELETS: 363 10*3/uL (ref 150–440)
RBC: 4.91 10*6/uL (ref 3.80–5.20)
RDW: 18.2 % — ABNORMAL HIGH (ref 11.5–14.5)
WBC: 11.4 10*3/uL — AB (ref 3.6–11.0)

## 2014-02-09 LAB — MAGNESIUM: Magnesium: 1.8 mg/dL

## 2014-02-09 LAB — PROTIME-INR
INR: 1.1
PROTHROMBIN TIME: 13.6 s (ref 11.5–14.7)

## 2014-02-09 LAB — TROPONIN I: Troponin-I: <0.02 ng/mL

## 2014-02-09 LAB — LIPASE, BLOOD: Lipase: 64 U/L — ABNORMAL LOW (ref 73–393)

## 2014-02-09 LAB — APTT: ACTIVATED PTT: 29.1 s (ref 23.6–35.9)

## 2014-02-09 NOTE — Telephone Encounter (Signed)
Pt called again, appointment scheduled today at 4:15 with Raquel

## 2014-02-09 NOTE — Telephone Encounter (Signed)
Pt is not eating very well & when she does eat, its going straight through her. She is also c/o itching. She denies any vomiting & she does seems a little weaker than normal requiring more assistance with walking.

## 2014-02-09 NOTE — Telephone Encounter (Signed)
Kendra HuaDavid called states pt has had diarrhea for more than a week.  States they have tried OTC medications which is not effective.  Attempted to return call however no answer or voicemail.  Please advise

## 2014-02-09 NOTE — Telephone Encounter (Signed)
With her age, not being able to eat without going to the bathroom, etc, I am concerned over dehydration,.  Will need stat labs and probably IVFs.  Please inform them to go to ER.  Thanks.

## 2014-02-09 NOTE — Telephone Encounter (Signed)
Given this pts multiple medical problems, need to know specifics about her current symptoms.  Need to know if eating.  Any vomiting, weakness, dizziness, increased trouble getting around - will probably need to go to ER.

## 2014-02-09 NOTE — Telephone Encounter (Signed)
Spoke with Janann Augustavid & Donna & notified them both.

## 2014-02-10 LAB — BASIC METABOLIC PANEL
Anion Gap: 6 — ABNORMAL LOW (ref 7–16)
BUN: 16 mg/dL (ref 7–18)
Calcium, Total: 7.9 mg/dL — ABNORMAL LOW (ref 8.5–10.1)
Chloride: 107 mmol/L (ref 98–107)
Co2: 28 mmol/L (ref 21–32)
Creatinine: 0.8 mg/dL (ref 0.60–1.30)
EGFR (African American): >60
Glucose: 115 mg/dL — ABNORMAL HIGH (ref 65–99)
Osmolality: 283 (ref 275–301)
Potassium: 3.9 mmol/L (ref 3.5–5.1)
Sodium: 141 mmol/L (ref 136–145)

## 2014-02-10 LAB — CBC WITH DIFFERENTIAL/PLATELET
Basophil #: 0.1 10*3/uL (ref 0.0–0.1)
Basophil %: 0.7 %
EOS PCT: 4 %
Eosinophil #: 0.4 10*3/uL (ref 0.0–0.7)
HCT: 30.7 % — AB (ref 35.0–47.0)
HGB: 9.7 g/dL — ABNORMAL LOW (ref 12.0–16.0)
LYMPHS PCT: 23.7 %
Lymphocyte #: 2.2 10*3/uL (ref 1.0–3.6)
MCH: 25.3 pg — ABNORMAL LOW (ref 26.0–34.0)
MCHC: 31.6 g/dL — AB (ref 32.0–36.0)
MCV: 80 fL (ref 80–100)
Monocyte #: 0.7 x10 3/mm (ref 0.2–0.9)
Monocyte %: 7.9 %
NEUTROS ABS: 5.8 10*3/uL (ref 1.4–6.5)
Neutrophil %: 63.7 %
Platelet: 279 10*3/uL (ref 150–440)
RBC: 3.84 10*6/uL (ref 3.80–5.20)
RDW: 18 % — ABNORMAL HIGH (ref 11.5–14.5)
WBC: 9.2 10*3/uL (ref 3.6–11.0)

## 2014-02-12 ENCOUNTER — Ambulatory Visit
Admit: 2014-02-12 | Disposition: A | Discharge status: Home / Self Care | Payer: Self-pay | Attending: Nurse Practitioner | Admitting: Nurse Practitioner

## 2014-02-12 ENCOUNTER — Ambulatory Visit: Payer: Self-pay | Admitting: Internal Medicine

## 2014-02-14 LAB — CULTURE, BLOOD (SINGLE)

## 2014-02-14 LAB — WOUND CULTURE

## 2014-03-11 ENCOUNTER — Ambulatory Visit: Payer: Medicare Other | Admitting: Internal Medicine

## 2014-03-13 ENCOUNTER — Inpatient Hospital Stay: Payer: Self-pay | Admitting: Internal Medicine

## 2014-03-13 LAB — CBC WITH DIFFERENTIAL/PLATELET
BASOS PCT: 0.7 %
Basophil #: 0.1 10*3/uL (ref 0.0–0.1)
Eosinophil #: 0.1 10*3/uL (ref 0.0–0.7)
Eosinophil %: 0.7 %
HCT: 36.7 % (ref 35.0–47.0)
HGB: 11.3 g/dL — ABNORMAL LOW (ref 12.0–16.0)
LYMPHS ABS: 1.5 10*3/uL (ref 1.0–3.6)
LYMPHS PCT: 11.6 %
MCH: 24.3 pg — ABNORMAL LOW (ref 26.0–34.0)
MCHC: 30.9 g/dL — ABNORMAL LOW (ref 32.0–36.0)
MCV: 79 fL — AB (ref 80–100)
MONO ABS: 0.9 x10 3/mm (ref 0.2–0.9)
Monocyte %: 6.5 %
NEUTROS PCT: 80.5 %
Neutrophil #: 10.6 10*3/uL — ABNORMAL HIGH (ref 1.4–6.5)
Platelet: 474 10*3/uL — ABNORMAL HIGH (ref 150–440)
RBC: 4.66 10*6/uL (ref 3.80–5.20)
RDW: 17.5 % — AB (ref 11.5–14.5)
WBC: 13.1 10*3/uL — AB (ref 3.6–11.0)

## 2014-03-13 LAB — URINALYSIS, COMPLETE
BILIRUBIN, UR: NEGATIVE
Bacteria: NONE SEEN
Blood: NEGATIVE
Glucose,UR: NEGATIVE mg/dL (ref 0–75)
Hyaline Cast: 1
Leukocyte Esterase: NEGATIVE
Nitrite: NEGATIVE
Ph: 6 (ref 4.5–8.0)
Protein: 25
RBC,UR: <1 /HPF (ref 0–5)
Specific Gravity: 1.015 (ref 1.003–1.030)
Squamous Epithelial: NONE SEEN

## 2014-03-13 LAB — BASIC METABOLIC PANEL
Anion Gap: 10 (ref 7–16)
BUN: 14 mg/dL (ref 7–18)
CHLORIDE: 103 mmol/L (ref 98–107)
Calcium, Total: 8.9 mg/dL (ref 8.5–10.1)
Co2: 30 mmol/L (ref 21–32)
Creatinine: 0.92 mg/dL (ref 0.60–1.30)
EGFR (African American): >60
EGFR (Non-African Amer.): 55 — ABNORMAL LOW
GLUCOSE: 161 mg/dL — AB (ref 65–99)
OSMOLALITY: 289 (ref 275–301)
Potassium: 4 mmol/L (ref 3.5–5.1)
Sodium: 143 mmol/L (ref 136–145)

## 2014-03-13 LAB — SEDIMENTATION RATE: Erythrocyte Sed Rate: 16 mm/hr (ref 0–30)

## 2014-03-15 ENCOUNTER — Ambulatory Visit
Admit: 2014-03-15 | Disposition: A | Discharge status: Home / Self Care | Payer: Self-pay | Attending: Nurse Practitioner | Admitting: Nurse Practitioner

## 2014-03-15 ENCOUNTER — Ambulatory Visit: Payer: Self-pay | Admitting: Internal Medicine

## 2014-03-15 LAB — CREATININE, SERUM
Creatinine: 0.86 mg/dL (ref 0.60–1.30)
EGFR (African American): >60
EGFR (Non-African Amer.): 59 — ABNORMAL LOW

## 2014-03-18 LAB — CULTURE, BLOOD (SINGLE)

## 2014-03-29 ENCOUNTER — Telehealth: Payer: Self-pay | Admitting: Internal Medicine

## 2014-03-29 NOTE — Telephone Encounter (Signed)
ER records received & placed in green folder (copy given to Forest)

## 2014-03-29 NOTE — Telephone Encounter (Signed)
Hospital records requested 

## 2014-03-29 NOTE — Telephone Encounter (Signed)
The patient was discharged from the hospital today . She is needing a hospital follow p appointment.

## 2014-03-30 ENCOUNTER — Telehealth: Payer: Self-pay | Admitting: *Deleted

## 2014-03-30 MED ORDER — QUETIAPINE FUMARATE 25 MG PO TABS: 25.0000 mg | ORAL_TABLET | Freq: Every day | ORAL | Status: AC

## 2014-03-30 NOTE — Telephone Encounter (Signed)
Can schedule a hospital follow up appt on 04/04/14 at 2:00 - block 30 min.  Make sure ok with appt date and time.  If needs anything before - let us know.

## 2014-03-30 NOTE — Telephone Encounter (Signed)
Erie Noe called requesting Rx for sun downing states pt becomes very anxious and combative in the evenings.  She is also requesting plan of care for gangrene on pts Great Toe and 4th digit states pt can not have both their service and wound center.  Please advise

## 2014-03-30 NOTE — Telephone Encounter (Signed)
Duplicate.  See attached.   

## 2014-03-30 NOTE — Telephone Encounter (Signed)
Prescription for seroquel  q hs #30 with one refill ok'd - [facility].  Spoke to Sorgho at length about pts condition.  She has been on seroquel previously and did well with this medication.  Will restart.  After reviewing records, hospital eval felt no further treatment could be done for toe/foot.  Recommended BKA.  Family declined.  Plans for comfort measures.

## 2014-03-30 NOTE — Telephone Encounter (Signed)
Hospital records show that patient was discharged on 03/17/14. I called & spoke with patient's son & he states that she has not back to hospital since then. Just needs a follow-up visit from her 03/17/14 discharge.

## 2014-04-01 DIAGNOSIS — J449 Chronic obstructive pulmonary disease, unspecified: Secondary | ICD-10-CM | POA: Diagnosis not present

## 2014-04-01 DIAGNOSIS — I10 Essential (primary) hypertension: Secondary | ICD-10-CM | POA: Diagnosis not present

## 2014-04-01 DIAGNOSIS — J4489 Other specified chronic obstructive pulmonary disease: Secondary | ICD-10-CM | POA: Diagnosis not present

## 2014-04-01 DIAGNOSIS — F0280 Dementia in other diseases classified elsewhere without behavioral disturbance: Secondary | ICD-10-CM | POA: Diagnosis not present

## 2014-04-04 ENCOUNTER — Telehealth: Payer: Self-pay | Admitting: Internal Medicine

## 2014-04-04 ENCOUNTER — Ambulatory Visit: Payer: Medicare Other | Admitting: Internal Medicine

## 2014-04-04 NOTE — Telephone Encounter (Signed)
FYI-I will cancel appt

## 2014-04-04 NOTE — Telephone Encounter (Signed)
Pts son called in and stated they took her back to [place] on Friday and she is not going to be able to make it. Cancel appt?

## 2014-04-14 ENCOUNTER — Telehealth: Payer: Self-pay | Admitting: *Deleted

## 2014-04-14 ENCOUNTER — Ambulatory Visit: Payer: Self-pay | Admitting: Internal Medicine

## 2014-04-14 NOTE — Telephone Encounter (Signed)
Left detailed message on voicemail.  

## 2014-04-14 NOTE — Telephone Encounter (Signed)
Left voicemail wanting to know where to send the machine back to so they are not continually getting billed for it? Not sure what machine they are speaking of, will get more information today.

## 2014-04-21 ENCOUNTER — Telehealth: Payer: Self-pay | Admitting: Internal Medicine

## 2014-04-21 NOTE — Telephone Encounter (Signed)
Form in blue folder.

## 2014-04-21 NOTE — Telephone Encounter (Signed)
Will review meds when faxed.

## 2014-04-21 NOTE — Telephone Encounter (Signed)
The patient is declining her medication . The Nurse from [facility] called stating that she thinks that the patient's P.O medications should be discontinued except her Seroquel , because the patient can be combative. She will fax over a medication list of what she thinks should be discontinued and also if there is a replacement. She also stated that the patients ventolin inhaler should stay in place . She refuses to take the Advair.

## 2014-04-22 NOTE — Telephone Encounter (Signed)
Form signed and placed in your box.

## 2014-04-22 NOTE — Telephone Encounter (Signed)
Order faxed.

## 2014-04-27 ENCOUNTER — Telehealth: Payer: Self-pay | Admitting: Internal Medicine

## 2014-04-27 NOTE — Telephone Encounter (Signed)
Pt son stopped by and stated that Mrs. Kendra Sutton is now at a nursing home. Son stated that each time she speaks with his mother she says that she is hungry but when staff at the nursing home are notified they will not give her more food. Son request that you order pt to have a boost supplement that is lactose free. Son stated that she also has the breathing machine that was order for pt but doesn't know where to return the machine. Please advise.msn

## 2014-04-28 NOTE — Telephone Encounter (Signed)
Need to know specifically where she is staying.  If she is staying at a nursing home, then there should be a physician at the facility that gives orders.  I do not mind ordering, but if at a skilled nursing facility - the physician of record gives the orders.  Regarding machine, just need to return to company that supplied.  They should have contact number or name if they have been getting supplies.

## 2014-04-29 NOTE — Telephone Encounter (Signed)
I spoke with son & could not understand him enough to get the name of the facility. However, I have [facility]. I will call them if you would like for me to try that route. Please advise.

## 2014-05-01 NOTE — Telephone Encounter (Signed)
If unable to get the information needed from the family, then I would contact them and get more specifics.  Ok for Boost supplements (lactose free nutritional supplements).

## 2014-05-02 NOTE — Telephone Encounter (Signed)
Tried to call [facility] twice & no answer or voicemail. Will try again tomorrow

## 2014-05-02 NOTE — Telephone Encounter (Signed)
Pt is at [facility]

## 2014-05-03 ENCOUNTER — Encounter: Payer: Self-pay | Admitting: Internal Medicine

## 2014-05-03 NOTE — Telephone Encounter (Signed)
Letter with order typed.

## 2014-05-03 NOTE — Telephone Encounter (Signed)
I was able to speak to someone today at [facility] & the facility physician is on vacation this week. We will need to fax a letter the nurses (fax# [phone]) to add Boost Supplement

## 2014-05-03 NOTE — Telephone Encounter (Signed)
Order faxed.

## 2014-05-16 ENCOUNTER — Telehealth: Payer: Self-pay

## 2014-05-16 NOTE — Telephone Encounter (Signed)
Please advise if I need to do anything. A Rx/letter for lactose free boost supplement was faxed to [facility] on 05/03/14 & I also notified son again today.
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
## 2014-07-11 ENCOUNTER — Telehealth: Payer: Self-pay | Admitting: Internal Medicine

## 2014-07-11 NOTE — Telephone Encounter (Signed)
Patient's son, Simonne MaffucciDavid Ruble called to let the doctor know that his mom passed away Sun. 05-20-2014.

## 2014-07-11 NOTE — Telephone Encounter (Signed)
Noted.  Will try to contact son.

## 2014-07-15 DEATH — deceased

## 2014-10-19 ENCOUNTER — Telehealth: Payer: Self-pay | Admitting: *Deleted

## 2014-10-19 NOTE — Telephone Encounter (Signed)
I placed a form on your desk.  I am not sure if we can fill out this form.  The pt is deceased.  Did not know who to contact.  Thanks for any help.

## 2014-10-19 NOTE — Telephone Encounter (Signed)
Son dropped off a Financial risk analyst"Cancer Claim for physician's statement" & wanted to know if Dr. Lorin PicketScott could fill it out. Please advise (placed in red folder)

## 2014-10-20 NOTE — Telephone Encounter (Signed)
I reviewed her chart.  I do not have actual documentation of a definite cancer.  Was she diagnosed with cancer at some point.  She has a history of an endometrial mass.  This was not biopsied to my knowledge.  No biopsy of the lung nodules.

## 2014-10-20 NOTE — Telephone Encounter (Signed)
Per Harriett SineNancy, yes you may complete the form for the family

## 2014-10-26 NOTE — Telephone Encounter (Signed)
Per Dr. Lorin PicketScott, unable to comple cancer form because patient did not have any documented diagnosis of cancer. Form mailed back to home.

## 2014-10-26 NOTE — Telephone Encounter (Signed)
Left message requesting more information.

## 2014-11-01 NOTE — Discharge Summary (Signed)
PATIENT NAME:  Kendra BlazingCOOPER, Soraida L MR#:  [id number] DATE OF BIRTH:  01/11/1922  DATE OF ADMISSION:  06/15/2012 DATE OF DISCHARGE:  06/19/2012  PRINCIPLE PROCEDURE:  1. Fractional dilation and curettage.  2. Hysteroscopy.   HOSPITAL COURSE: This is an 81 year old female with an endometrial mass and pyometra who was admitted to [facility] for an outpatient procedure to sample the endometrial cavity. The patient underwent fractional dilation and curettage and hysteroscopy which was complicated by presumed uterine perforation given significant cervical stenosis. The patient was observed overnight, the day of the procedure, given rectal pressure. Ultimately, the patient was changed to an inpatient when postoperative day number one she developed a fever to 102 and elevated white blood count of 21.8 thousand. The patient was promptly started on Flagyl intravenous, 500 mg every eight hours, and ciprofloxacin, 400 mg twice a day. A general surgery consult was obtained postoperative day number one given the likelihood that there may have been intraabdominal injury due to the uterine perforation. The patient did undergo a CT scan of the abdomen and pelvis with IV contrast and oral contrast which was limited based on the patient's tolerance to the oral contrast. Findings showed some air next to the uterus, no definitive extravasation of contrast into the intra-abdominal cavity, i.e. there was no clear evidence that there was visceral injury. The patient was moved to the Critical Care Unit given the patient's age and potential risk for intraabdominal infection. Postoperative day number two hematocrit had drifted down to 26.5; therefore, the patient was given a 1 unit blood transfusion, which she tolerated well. The patient defervesced after postoperative day number one and remained afebrile through the rest of the hospitalization. White blood count trended downwards f range of motion 21.8 thousand to  14.5 thousand on the day of discharge. Abdomen examination improved. The patient did not have any tenderness on discharge. She was having loose stools which was worked up for C. difficile, which was negative. The patient is discharged to home in good condition.   DISCHARGE MEDICATIONS: She will remain on her home medications in addition to three more days of Flagyl 500 mg p.o. every 8 hours and ciprofloxacin 250 mg p.o. twice a day.      DISCHARGE INSTRUCTIONS/FOLLOWUP: I will see the patient at [facility] in one week. She is given precautions to return before this one-week appointment if she has increasing abdominal pain, fever, nausea, or vomiting. ____________________________ Suzy Bouchardhomas J. Kamen Hanken, MD tjs:slb D: 06/19/2012 09:30:32 ET T: 06/19/2012 12:38:36 ET JOB#: [id number]  cc: Suzy Bouchardhomas J. Elija Mccamish, MD, <Dictator> Suzy BouchardHOMAS J Garren Greenman MD ELECTRONICALLY SIGNED 06/22/2012 10:54

## 2014-11-01 NOTE — Op Note (Signed)
PATIENT NAME:  [name], [name] MR#:  [id number] DATE OF BIRTH:  04-12-1922  DATE OF PROCEDURE:  06/15/2012  PREOPERATIVE DIAGNOSES:   1. Endometrial mass.  2. Pyometra.   POSTOPERATIVE DIAGNOSES:  1. Endometrial mass.  2. Pyometra.     PROCEDURES:  1. Fractional dilation and curettage.  2. Hysteroscopy. 3. Pap smear.   SURGEON: Suzy Bouchardhomas J. Aidin Doane, MD.  ANESTHESIA:  MAC.  INDICATIONS: This is an 81 year old gravida 3, para 3 female with pyometra in the office with inability to sample the endometrial cavity. Ultrasound demonstrated endometrial mass.   DESCRIPTION OF PROCEDURE: After adequate MAC anesthesia, the patient was placed in the dorsal supine position with the legs in the candy-cane stirrups. Lower abdomen, perineal and vaginal prep was performed with Betadine. The patient was sterilely draped. A Red Robinson catheter used to catheterize bladder, which yielded 100 mL clear urine. A weighted speculum was placed in the posterior vaginal vault. The cervix is flushed with the vaginal vault. Ultimately, the anterior cervix was grasped with tenaculum and placed on tension. Extremely stenotic cervical os was dilated with cervical dilators. ECC performed before this dilation took place and uterus sounded to 8 cm. The cervix was then dilated all the way to a #18 Hanks dilator and curettage performed with scant tissue removed. A small amount of pyometrium was noted with purulent discharge at the cervical os. Hysteroscope was attempted to be placed inside the endometrial cavity. Poor visualization resulted giving the possibility of a false passage, but this was not definitively ascertained. Normal saline was used as distending medium. The procedure was terminated. There were no complications. Estimated blood loss minimal. Intraoperative fluids were 600 mL. The patient tolerated the procedure well. The patient was taken to the recovery room in good condition.    ____________________________ Suzy Bouchardhomas J. July Linam, MD tjs:ap D: 06/15/2012 11:50:33 ET T: 06/15/2012 11:56:40 ET JOB#: [id number]  cc: Suzy Bouchardhomas J. Akansha Wyche, MD, <Dictator> Suzy BouchardHOMAS J Sanai Frick MD ELECTRONICALLY SIGNED 06/17/2012 18:01

## 2014-11-01 NOTE — Consult Note (Signed)
PATIENT NAME:  Kendra Sutton, Kendra Sutton MR#:  [id number] DATE OF BIRTH:  Jun 22, 1922  DATE OF CONSULTATION:  06/16/2012  REFERRING PHYSICIAN:  Jennell Cornerhomas Schermerhorn, MD CONSULTING PHYSICIAN:  Quentin Orealph Sutton. Ely III, MD  PRIMARY CARE PHYSICIAN: Marisue IvanKanhka Linthavong, MD  BRIEF HISTORY: Kendra EatonKatie Sutton is an 81 year old woman admitted to the hospital following hysteroscopy and dilation and curettage yesterday. The patient has a history of what was felt to be a myometrial mass and questionable neoplasm and possible malignancy. She has had recent problems with pyometra and so she was admitted for further evaluation and possible surgery. After discussion with the family, she underwent a dilation and curettage, hysteroscopy procedure. Postoperatively, she had significant abdominal discomfort and significant urinary urgency. This morning she had a temperature of 101 with white blood cell count elevated at 20,000. Abdominal exam revealed moderate distention and she had significant lower abdominal discomfort, and the surgical service was consulted. While I was operating, CT scan with contrast was performed. There did not appear to be any evidence of GI extravasation. There was a significant amount of air in the bladder and with some fluid in the pelvis, some free air in the pelvis, she had possible uterine perforation.   She was mildly hypotensive and was transferred to the intensive care unit for further evaluation.   She has multiple medical problems including severe dementia, hypertension, chronic obstructive lung disease, chronic urinary tract infections, valvular heart disease, pacemaker implantation for sick sinus syndrome, and paroxysmal atrial fibrillation. She has had partial parathyroidectomy in addition to the procedures noted above. She has not had a recent colonoscopy.   Medications are outlined in the admission note as are her allergies. Review of systems is not possible as the patient is sedated and confused.   PHYSICAL  EXAMINATION:   VITALS: Heart rate is 91 and regular, temperature 102.2, blood pressure 119/52, and oxygen saturation is 98% on 2 liters.  GENERAL:  She appears comfortable and is resting.   HEENT: Examination reveals her to be pale and a bit washed out with no scleral icterus, no pupillary abnormalities, and no facial deformities.   NECK: Supple and nontender with no adenopathy. She has a midline trachea.   PULMONARY: Chest is clear with no adventitious sounds and she has normal pulmonary excursion.   CARDIAC: No murmurs or gallops to my ear and she seems to be in normal sinus rhythm.   ABDOMEN: Mildly distended with marked lower quadrant tenderness, some guarding, but no rebound. Hypoactive bowel sounds but no masses are noted.   EXTREMITIES: Lower extremity exam reveals limited range of motion. No deformities and nonpalpable distal pulses.   PSYCHIATRIC: Exam is not possible with her dementia. She is markedly confused and disoriented.   ASSESSMENT AND PLAN: I have independently reviewed the CT scan. She appears to have a tremendous amount of air in her bladder which to me seems to be abnormal in appearance. I would be concerned about the possibility of extraperitoneal bladder injury from the dilation and curettage. I do not see any evidence of a bowel injury. I suspect she probably does have a small perforation of the uterus and possible penetration of the bladder. I wonder if the bladder was not insufflated with hysteroscopy. There is some abnormality to the fluid layer in the bladder, also suggesting possible blood in the bladder. The patient has significant urgency symptoms suggestive of bladder irritation.   At the present time, I do not see any indication for surgical intervention. She is a very  poor surgical candidate with multiple risk factors. I would not recommend removing her Foley at the present time. With adequate drainage, most of these problems will resolve spontaneously. Should  she continue to decline, we would have to discuss surgical intervention with the family and probably involve urology. I have talked to her son who wants [place] to do everything necessary, but        the patient is also having a palliative care consult with the family so we will wait and see what the outcome of that consultation might be. At the present time, I do not recommend any surgical intervention.  ____________________________ Quentin Ore III, MD rle:slb D: 06/16/2012 18:28:02 ET T: 06/17/2012 08:03:28 ET JOB#: [id number]  cc: Quentin Ore III, MD, <Dictator> Suzy Bouchard, MD Quentin Ore MD ELECTRONICALLY SIGNED 06/18/2012 17:10

## 2014-11-04 NOTE — Discharge Summary (Signed)
PATIENT NAME:  Kendra Sutton, Almendra L MR#:  [id number] DATE OF BIRTH:  Mar 08, 1922  DATE OF ADMISSION:  09/11/2012 DATE OF DISCHARGE:  09/14/2012  ADMITTING DIAGNOSIS: Shortness of breath and cough.   DISCHARGE DIAGNOSES:  1.  Shortness of breath due to acute chronic obstructive pulmonary disease exacerbation with likely acute bronchitis.  2.  Acute delirium during hospitalization related to her age and underlying likely dementia, now back to baseline.  3.  Status post pacemaker. Sick sinus syndrome, status post pacemaker placement.  4.  Hypertension.  5.  Gastroesophageal reflux disease.  6.  History of hypothyroidism.  7.  Hypercholesterolemia. , 8.  Diabetes.  9.  History of urinary tract infection with multi-resistant organisms as well as ESEL in the urine.  10. History of diverticulosis.  11. History of valvular disease.  12. History of paroxysmal atrial fibrillation.  13. History of peptic ulcer disease with Helicobacter pylori being positive.  14. History of rectal adenoma, status post resection.  15. Status post hysterectomy.  PERTINENT LABORATORY AND EVALUATIONS: EKG showed electronic atrial pacemaker. PA and lateral chest x-ray showed mild interstitial opacities that appear slightly more suspicious than prior. Possible atypical infection cannot be ruled out. Subcentimeter nodular density in the right mid lung, may be artificial. ABG showed pH of 7.31, pCO2 of 71. Troponin was less than 0.02. INR was 0.9. BMP: Sodium was 135, potassium 4.5, chloride 99, CO2 was 35. BNP was 469. Blood cultures x 2 no growth.   HOSPITAL COURSE: Please refer to history and physical done by the admitting physician. The patient is an 81 year old white female with history of COPD in the past presumably with acute onset of shortness of breath as well as some cough and congestion. The patient was thought to have an acute COPD flare. She also was thought to have acute bronchitis. The patient was admitted and placed  on treatment with antibiotics and nebulizers. With these treatments, her symptoms improved significantly. However, the patient started becoming delirious and confused, so she had to be kept in the hospital until the delirium cleared up. The patient is doing much better. She is having no respiratory difficulties. At this point, she is doing much better and is stable for discharge.   DISCHARGE MEDICATIONS: Metoprolol tartrate 50 mg 1 tab p.o. b.i.d., nystatin and triamcinolone apply to affected areas 3 times a day, Advair Diskus 1 puff b.i.d., aspirin 81 mg 1 tab p.o. daily, albuterol 2 puffs q. 4 hours p.r.n., levothyroxine 75 mcg daily, Tylenol 650 mg q. 4 hours p.r.n., Ceftin 500 mg 1 tab p.o. b.i.d. x 2 days and prednisone taper 4 tablets x 1 day, 3 tablets x 1 day, 2 tabs x 1 day and 1 tab x 1 day.  HOME OXYGEN: None.   DIET: Low sodium.   ACTIVITY: As tolerated.   FOLLOWUP: With primary physician in 1 to 2 weeks.  TIME SPENT: 35 minutes.    ____________________________ Lacie ScottsShreyang H. Allena KatzPatel, MD shp:aw D: 09/14/2012 16:42:53 ET T: 09/15/2012 09:09:31 ET JOB#: [id number]  cc: Shreyang H. Allena KatzPatel, MD, <Dictator> Charise CarwinSHREYANG H PATEL MD ELECTRONICALLY SIGNED 09/16/2012 13:06

## 2014-11-04 NOTE — H&P (Signed)
PATIENT NAME:  Kendra Sutton, Kendra Sutton MR#:  [id number] DATE OF BIRTH:  08/18/1921  DATE OF ADMISSION:  07/06/2013  PRIMARY CARE PHYSICIAN:  Dale Durhamharlene Scott, MD  CHIEF COMPLAINT: Weakness, cough, shortness of breath.   HISTORY OF PRESENTING ILLNESS: An 81 year old Caucasian female patient with history of COPD, hypertension, GERD, hypothyroidism, rectal adenoma status post resection who lives at home with a caregiver, presents to the Emergency Room brought in for weakness, cough and also some itching in the genital area. The patient is a poor historian, does seem to have significant dementia with some confusion. Only is oriented to place, but not time. She mentions that she has been coughing more, feeling weak and short of breath. She is not sure why she was brought to the Emergency Room or who brought her here; but she does remember her son's telephone number.   She denies any pain, continues to cough all through the time I was in the room. This is nonproductive. Does not complain of any chest pain, nausea or vomiting, poor appetite.   The patient was supposedly recently treated for pneumonia 3 weeks prior with oral antibiotics. I tried contacting patient's son but was unsuccessful, but Dr. Ethelda ChickJacubowitz of Emergency Room previously had conversation with the son who mentioned some confusion, weakness and unresolving cough. Today's chest x-ray shows right lower lobe pneumonia, although it is not clear if this is new or just resolving old pneumonia.   ALLERGIES: CIPRO, PENICILLIN, SULFA.   PAST MEDICAL HISTORY:  1.  Status post pacemaker.  2.  Hypertension.  3.  GERD. 4.   hypothyroidism.  5.  Hypercholesterolemia.  6.  Diabetes.  7.  UTI with multidrug-resistant organisms.  8.  ESBL.  9.  Diverticulosis.  10.  Valvular heart disease, paroxysmal afib. 11.  Peptic ulcer disease with H. pylori.  12.  Rectal adenoma, status post resection.  13.  Sick sinus syndrome.  14.  Anemia of chronic disease.  15.   Diverticulosis.  16.  Partial thyroidectomy.  17.  Malnutrition.   SOCIAL HISTORY: The patient lives at home with her son. Has a caregiver during the daytime for a few hours. Used to wear oxygen in the past, but not anymore. Does not smoke. Nonalcoholic.  FAMILY HISTORY:  Positive for COPD, new onset of carcinoma, diabetes and coronary artery disease.  REVIEW OF SYSTEMS:  Unobtainable secondary to patient's confusion, dementia.   HOME MEDICATIONS: Include:  1.  Advair Diskus 150 inhaled 2 times a day. 2.  Anusol rectal suppository 2 times a day.  3.  Aspirin 81 mg daily.  4.  Levothyroxine 75 mcg daily.  5.  Metoprolol tartrate 50 mg 2 times a day.  6.  Multivitamin 1 tablet daily.  7.  Proctosol, apply topically to affected area 2 times a day.   PHYSICAL EXAMINATION: VITAL SIGNS: Shows temperature 98, pulse of 82, blood pressure 142/78, saturating 91% on room air.  GENERAL: Elderly, frail Caucasian female patient sitting up in bed in mild respiratory distress. Has about 1-line conversational dyspnea.  PSYCHIATRIC: Alert, awake, oriented to place but not to person or time, but pleasant.  HEENT: Atraumatic, normocephalic. Oral mucosa moist and pink. external ears and  Nose Normal. Pallor positive. No icterus. Pupils bilaterally equal and reactive to light.  NECK: Supple. No thyromegaly. No palpable lymph nodes. Trachea midline. No carotid bruits or JVD.  CARDIOVASCULAR: S1, S2, without any murmurs. Peripheral pulses 2+. No edema.  RESPIRATORY: Decreased air entry on both sides with expiratory  wheezes. The patient continually coughs during exam.  GASTROINTESTINAL: Soft abdomen, nontender. Bowel sounds present. No hepatosplenomegaly palpable.  SKIN: Warm and dry. No petechiae, rash, ulcers.  GENITOURINARY: No CVA tenderness or bladder distention.  SKIN: Warm and rash. No petechia, rash or ulcers.  MUSCULOSKELETAL: No joint swelling, redness, effusion of the large joints. Normal muscle  tone.  NEUROLOGICAL: Motor strength 5/5 in upper extremities.  LYMPHATIC: No cervical lymphadenopathy.   LABORATORY STUDIES: Do show glucose 138, BUN 15, creatinine 0.79, sodium 136, potassium of 4.2. AST, ALT, alkaline phosphatase, bilirubin normal. WBC 10.6, hemoglobin 12.4, platelets 368. Urinalysis shows 334 WBCs but no bacteria. He has 11 epithelial cells. Nitrite negative.   Chest x-ray shows chronic changes, pacemaker leads in place, emphysematous  changes and focal infiltrate in the right base.   ASSESSMENT AND PLAN: 1.  Right lower lobe pneumonia without any fever or white blood cell count elevated. It is unclear if the patient has resolving pneumonia on her chest x-ray or if this is a new infiltrate compared to recently treated pneumonia. We will start her on ceftriaxone and azithromycin. But I believe the patient's main issue is some exacerbation of her chronic obstructive pulmonary disease. We will start her on low-dose steroid, along with nebulizers, continue her Advair. Incentive spirometer. Oxygen as needed. The patient was on home oxygen in the past. We will need to ambulate the patient. If saturations are less than 88%, she will need home oxygen. Presently saturations are at 91%.  2.  Weakness likely secondary from the chronic obstructive pulmonary exacerbation and patient has been slowly worsening over the past few months. I have discussed with the patient and she does want to be a FULL CODE.  3.  Possible urinary tract infection. The patient does not have any bacteria in the urine but does have 334 WBCs. We will send for urine cultures as patient is on antibiotics, which should cover for urinary tract infection. Await final ID sensitivities from the urine. 4.  Diabetes mellitus. The patient is not on any medications for this. We will put her on sliding scale insulin.  5.  Hypertension. Continue metoprolol.  6.  Paroxysmal atrial fibrillation, presently in normal sinus rhythm.  7.   Deep vein thrombosis prophylaxis.  Lovenox.  CODE STATUS: FULL CODE.   TIME SPENT ON THIS CASE: 50 minutes    ____________________________ Molinda Bailiff. Adryen Cookson, MD srs:ce D: 07/06/2013 16:18:56 ET T: 07/06/2013 16:32:08 ET JOB#: [id number]  cc: Wardell Heath R. Jahnyla Parrillo, MD, <Dictator> Dale [place], MD Orie Fisherman MD ELECTRONICALLY SIGNED 07/07/2013 14:23

## 2014-11-04 NOTE — H&P (Signed)
PATIENT NAME:  Kendra Sutton MR#:  [id number] DATE OF BIRTH:  05-19-1922  DATE OF ADMISSION:  09/11/2012  PRIMARY CARE PHYSICIAN: Kendra Durhamharlene Scott, MD   CHIEF COMPLAINT: Increasing shortness of breath, cough and cold symptoms for more than 1 week.   HISTORY OF PRESENT ILLNESS:  Kendra Sutton is a pleasant 81 year old Caucasian female who has history of chronic respiratory failure/ COPD, on home oxygen, history of hypertension, GERD and hypothyroidism who had a pacemaker placed in many years ago who comes in with increasing shortness of breath after an episode of URI symptoms. She was seen by primary care physician, received a course of antibiotic and cough medicine was given; however, she did not improved and went to see her primary care physician today and was sent to the Emergency Room since the symptoms were not improved. In the Emergency Room, she was found to be hypercarbic and placed on BiPAP. She received a dose of Solu-Medrol, nebulizer treatment, Rocephin and Zithromax. She is being admitted for acute on chronic hypercarbic respiratory failure secondary to COPD flare.   ALLERGIES:  CIPRO, PENICILLIN AND SULFA.   PAST MEDICAL HISTORY: 1.  Status post pacemaker.  2.  Hypertension.  3.  GERD. 4.  Goiter/hypothyroidism.  6.  Hypercholesterolemia.  7.  Diabetes.  8.  History of urinary tract infection with multidrug-resistant organism.  9.  History of ESBL in the urine.   10.  Diverticulosis.  11.  Valvular heart disease/paroxysmal A-fib.  12.  History of peptic ulcer disease with H. pylori positivity.  13.  History of rectal adenoma status post resection.  14.  Sick sinus syndrome, status post pacemaker placement.    PAST SURGICAL HISTORY: Hysterectomy.  CURRENT MEDICATIONS: 1.  Nystatin/triamcinolone apply small area to effected area as needed.  2.  MVI p.o. daily.  3.  Metoprolol 50 mg b.i.d.  4.  Levothyroxine 75 mcg daily.  5.  Aspirin 81 mg daily.  6.  Albuterol inhaler 2  puffs every 4 hours.  7.  Advair 100/50 one puff b.i.d.   SOCIAL HISTORY: Lives at home. Her sons check on her. She has a caregiver a few hours a day.  She uses 2 liters of nasal cannula oxygen. Nonsmoker. Nonalcoholic.   FAMILY HISTORY: Positive for COPD, renal cell carcinoma, diabetes and CAD.   REVIEW OF SYSTEMS: CONSTITUTIONAL: No fever. Positive for fatigue, weakness.  EYES: No blurred or double vision or cataracts.  ENT: No tinnitus, ear pain, hearing loss.  RESPIRATORY: Positive for cough, wheeze, COPD flare.  CARDIOVASCULAR: Positive for orthopnea, dyspnea on exertion and hypertension.  GASTROINTESTINAL: No nausea, vomiting, diarrhea or abdominal pain. Positive for GERD.  GENITOURINARY: No dysuria, hematuria or renal calculus.  ENDOCRINE: No polyuria, nocturia or thyroid problems.  HEMATOLOGY: No anemia or easy bruising.  SKIN: No acne or rash.  MUSCULOSKELETAL: Positive for arthritis.  NEUROLOGIC: No CVA or TIA or migraine.  PSYCH: No anxiety or depression. All other systems reviewed and negative.   PHYSICAL EXAMINATION: GENERAL: The patient is awake, alert and oriented x 3. The patient is thin, is cachetic. Her weight is 100 pounds. VITAL SIGNS: Pulse 60, blood pressure 137/80 and sats 92% on BiPAP. HEENT: Atraumatic, normocephalic. Pupils are equal, round and reactive to light and accommodation. Extraocular movements intact. Oral mucosa is dry.  NECK: Supple. No JVD. No carotid bruit.  RESPIRATORY: Decreased breath sounds along with distant breath sounds. No wheezing heard. I could not hear any breath sounds in the bases. No use of  accessory muscles or respiratory distress at this time. The patient currently is on BiPAP.  CARDIOVASCULAR: Both the heart sounds are normal. Rate and rhythm regular. PMI not lateralized. Chest is nontender.  EXTREMITIES: Good pedal pulses. Good femoral pulses. No lower extremity edema.  ABDOMEN: Soft, benign and nontender. No organomegaly.  Positive bowel sounds.  NEUROLOGIC: Grossly intact cranial nerves II through XII. No motor or sensory deficits.  PSYCH: The patient is awake, alert and oriented x 2.   LABORATORY DATA: White count is 13.0. The rest of the hemogram normal. B-type natriuretic peptide is 469. First set of cardiac enzymes negative. PT-INR within normal limits. Glucose 114, BUN 15, creatinine 0.73, sodium 135, potassium 4.5 and bicarb is 35. The rest of the electrolytes remain normal. PH is 7.31 and pCO2 is 71.   ASSESSMENT: 81 year old Kendra Sutton with history of chronic respiratory failure/chronic obstructive pulmonary disease, on home oxygen, with hypertension and hypothyroidism who had symptoms of upper respiratory infection about 7 to 10 days ago. Completed a course of antibiotic.  Symptoms continued to worsen to the point she came in with:  1. Acute hypercarbic respiratory failure due to chronic obstructive pulmonary disease exacerbation. We will admit the patient to off-unit medical floor, continue the patient on BiPAP, and give her breakthrough with nasal cannula as tolerated. We will give around-the-clock steroids, continue empiric antibiotics with Rocephin and Zithromax, follow up sputum culture, blood culture, continue nebulizer, oral inhalers and incentive spirometry.  2.  Upper respiratory infection. Symptomatic treatment with p.r.n. Tylenol.  3.  Suspected acute bronchitis. We will send sputum for culture if able to produce. Follow blood cultures. Continue empiric Rocephin and Zithromax.  4.  Hypothyroidism. Continue home dose of levothyroxine.  5.  Chronic respiratory failure. The patient is on home oxygen. We will continue oxygen once the patient is weaned off the BiPAP.  6.  Hypertension. Continue metoprolol.  7.  Deep vein thrombosis prophylaxis with subcutaneous heparin.        Further workup according to the patient's clinical course. Hospital admission plan was discussed with the patient, the  patient's son and caregiver. The patient is a FULL CODE. This was confirmed with family member.   TIME SPENT: 50 minutes. ____________________________ Wylie Hail Allena Katz, MD sap:sb D: 09/11/2012 13:31:19 ET T: 09/11/2012 14:16:01 ET JOB#: [id number]  cc: Alivia Cimino A. Allena Katz, MD, <Dictator> Kendra [place], MD Willow Ora MD ELECTRONICALLY SIGNED 09/15/2012 14:53

## 2014-11-05 NOTE — Discharge Summary (Signed)
PATIENT NAME:  Kendra Sutton, Rochanda L MR#:  [id number] DATE OF BIRTH:  04-24-1922  DATE OF ADMISSION:  02/09/2014 DATE OF DISCHARGE:  02/11/2014  DISCHARGED TO REHABILITATION PENDING INSURANCE AUTHORIZATION.   PRIMARY CARE PHYSICIAN: Dr. Dale Durhamharlene Scott.   CHIEF COMPLAINT: Diarrhea and weakness.   PRESENT DIAGNOSES:  1.  Severe peripheral vascular disease with dry gangrene, left first toe. Ulcer appears chronic and dry gangrene.  2.  Hypertension.  3.  Chronic obstructive pulmonary disease on 2 liters nasal cannula oxygen.  4.  Diarrhea, resolved.  5.  Type 2 diabetes, on sliding scale at present.  6.  Dementia with confusion and overall declined per family.  7.  CODE STATUS: FOR NOW FULL CODE.   CONSULTATIONS:  1.  Dr. Harvie JuniorPhifer, Palliative.  2.  Dr. Orland Jarredroxler, Podiatry.  3.  Vascular, Dr. Wyn Quakerew.   LABORATORY DATA:  Glucose is 109. Wound, anaerobic and aerobic culture: A few gram-negative rods. A few gram-positive cocci. White count is 9.2 hemoglobin and hematocrit of 9.7 and 30.7, platelet count is 279,000. Creatinine is 1.80. Sodium is 141, potassium is 3.9, chloride is 107, bicarbonate is 28. Blood culture negative in 18-24 hours. Urinalysis negative for urinary tract infection.   Chest x-ray:  Stable. Radiographic appearance of chest, no acute cardiopulmonary process.   Lipase is 64.   MEDICATIONS AT DISCHARGE: 1.  Collagenase ointment applied to affected area on the dry gangrene with wet-to-dry saline dressing mixed with 50-50 with Mupirocin. 2.  Ativan 0.5 mg q. 8 p.r.n.  3.  Levaquin 250 mg p.o. daily for 10 days.  4.  Synthroid 0.075 mg daily.  5.  Mupirocin 2% topical ointment on affected area b.i.d.  6.  Senokot 1 tablet b.i.d. p.r.n.  7.  Advair 250/50 one puff b.i.d.  8.  Plavix 75 mg daily.  9.  Lopressor 25 mg b.i.d.  10. Tylenol 650 q. 4 p.r.n.  11. Colace 100 mg b.i.d.  12. Sliding scale insulin.   DISCHARGE INSTRUCTIONS: 1.  Physical therapy to see patient.  2.  Soft  diet. 3.  Wound dressing as per instructions.  4.  Followup with Dr. Wyn Quakerew in 2-4 weeks, vascular surgery.   BRIEF SUMMARY OF HOSPITAL COURSE: Kendra Sutton an 81 year old Caucasian female with history of hypertension, diabetes, and peripheral vascular disease who presented with ulcer on the great toe with some lower extremity redness. She was admitted with:  1.  Peripheral vascular disease with dry gangrene and ulcer on the left great toe. The ulcer is  chronic, seen by vascular recently. Had a PTA on the left leg done.  The x-ray of the toe was noted to have osteomyelitis.  Podiatry consultation with Dr. Orland Jarredroxler was obtained, recommends no procedure given her overall decline and several comorbidities. This was discussed with the patient's son Velda ShellJames Phillips who does understand risks for chronic osteomyelitis. The patient was started on broad-spectrum antibiotics, changed to p.o. Levaquin for 10 days. Her white count remained stable. Local wound dressing changes will be continued.   2.  Hypertension. Stable.   3.  Chronic obstructive pulmonary disease on 2 liters nasal cannula oxygen. Continue Advair and DuoNeb.   4.  Diarrhea, resolved.   5.  Type 2 diabetes. We will place patient right now on sliding scale insulin only until p.o. intake improves.   6.  Dementia with confusion per sons.   Patient is overall declining, is unable to care for herself at home. Sons did agreed for patient to go to facility  for now, since it is unsafe to return home at this time.   7.  Dr. Harvie Junior saw the patient and discussed with the sons about CODE STATUS. The patient still, as of now, is a FULL CODE. She will be discharged to rehabilitation SNF when a bed is available and insurance authorization is approved.  TIME SPENT: 40 minutes.   THE PATIENT IS A FULL CODE.    ____________________________ Jearl Klinefelter A. Allena Katz, MD sap:ts D: 02/11/2014 12:59:12 ET T: 02/11/2014 14:27:38 ET JOB#: [id number]  cc: Burke Terry A. Allena Katz, MD,  <Dictator> Willow Ora MD ELECTRONICALLY SIGNED 02/23/2014 11:39

## 2014-11-05 NOTE — Op Note (Signed)
PATIENT NAME:  Kendra Sutton, Kendra Sutton MR#:  [id number] DATE OF BIRTH:  1921-08-08  DATE OF PROCEDURE:  01/12/2014  PREOPERATIVE DIAGNOSES:  1. Peripheral arterial disease with ulceration, left lower extremity.  2. Dementia.  3. Chronic obstructive pulmonary disease.  4. Hypertension.   POSTOPERATIVE DIAGNOSES:  1. Peripheral arterial disease with ulceration, left lower extremity.  2. Dementia.  3. Chronic obstructive pulmonary disease.  4. Hypertension.   PROCEDURE:  1. Ultrasound guidance for vascular access, right femoral artery.  2. Catheter placement to left peroneal and anterior tibial arteries from right femoral approach.  3. Aortogram and selective left lower extremity angiogram.  4. Drug-coated angioplasty balloon to the left popliteal artery using a 5 mm diameter x 10 cm length Lutonix drug-coated angioplasty balloon.  5. Percutaneous transluminal angioplasty of the left anterior tibial artery with a 2 mm diameter balloon distally and a 3 mm diameter balloon proximally.  6. Percutaneous transluminal angioplasty of left peroneal artery with a 2 mm diameter angioplasty balloon distally and a 3 mm diameter angioplasty balloon proximally.  7. StarClose closure device, right femoral artery.   SURGEON: Annice NeedyJason S. Makynzee Tigges, MD   ANESTHESIA: Local with moderate conscious sedation.   ESTIMATED BLOOD LOSS: 25 mL.  CONTRAST USED: 90 mL Visipaque.   INDICATION FOR PROCEDURE: This is an 81 year old white female with a long history of peripheral vascular disease. She has undergone 2 previous interventions. She still has a nonhealing ulceration and she has reduced perfusion again on her most recent noninvasive studies. She is brought in for angiogram for further evaluation and potential treatment. Risks and benefits were discussed. Informed consent is obtained.   DESCRIPTION OF PROCEDURE: The patient was brought to the vascular suite. Groins were shaved and prepped and a sterile surgical field was  created. The right femoral head was localized with fluoroscopy. The right femoral head was accessed without difficulty with a Seldinger needle under direct ultrasound guidance and a 5-French sheath was then placed. Permanent image was recorded. A pigtail catheter was placed in the aorta at the L1-L2 level and AP aortogram was performed. This showed no flow-limiting stenosis within the aortoiliac segments of patent renal arteries bilaterally. I then crossed the aortic bifurcation and advanced to the left femoral head. Selective left lower extremity angiogram was then performed. This showed normal common femoral artery, profunda femoris artery.  The superficial femoral artery has some mild irregularity but no significant stenosis. The popliteal artery just above the knee and at the level of the knee had some stenosis over about an 8-10 cm segment that at its worst was in the 75% range. There was then occlusion of all tibial vessels. The peroneal artery appeared to have the best target distally.  The anterior tibial artery also appeared to have some flow distally, although none in the ankle and foot. The patient was heparinized. A 6-French Ansell sheath was placed over a Truman advantage wire and I used the [employer] advantage wire and a Kumpe catheter to get down initially into the anterior tibial artery. I then exchanged for a CXI catheter and an 0.018 wire. I was able to get down to just above the ankle and the anterior tibial artery with a catheter. I took a 2 mm diameter angioplasty balloon from just above the ankle to the proximal anterior tibial artery. The mid to proximal anterior tibial artery was then treated with a 3 mm diameter angioplasty balloon as well. This did improve the collateralization but did not improve the flow significantly  into the foot. I then turned my attention to the peroneal artery. The CXI catheter and 0.018 wire were used to advance into the lateral tarsal artery at the ankle and then  replaced the 0.018 wire.  The 2 mm diameter angioplasty balloon was taken down to the lateral tarsal artery off of the peroneal artery and the peroneal artery was treated with this 2 mm balloon. Again, for the more proximal peroneal artery and tibioperoneal trunk, I inflated a 3 mm diameter angioplasty balloon. Following this, there was markedly improved flow, particularly through the peroneal artery. The anterior tibial artery still had poor flow distally. The popliteal lesion was treated with a 5 mm diameter x 10 cm in length Lutonix drug-coated angioplasty balloon with a good angiographic result and less than 20% residual stenosis. At this point, I elected to terminate the procedure. The sheath was removed.  StarClose closure device was deployed in the usual fashion with excellent hemostatic result. The patient tolerated the procedure well and was taken to the recovery room in stable condition.    ____________________________ Annice Needy, MD jsd:dd D: 01/12/2014 16:47:09 ET T: 01/13/2014 05:16:27 ET JOB#: [id number]  cc: Annice Needy, MD, <Dictator> Annice Needy MD ELECTRONICALLY SIGNED 01/17/2014 16:30

## 2014-11-05 NOTE — Op Note (Signed)
PATIENT NAME:  [name], Brittania L MR#:  [id number] DATE OF BIRTH:  10/10/1921  DATE OF PROCEDURE:  09/27/2013  PREOPERATIVE DIAGNOSES:  1.  Peripheral arterial disease with ulceration and rest pain, left lower extremity.  2.  Hypertension.  3.  Diabetes.   POSTOPERATIVE DIAGNOSES:  1.  Peripheral arterial disease with ulceration and rest pain, left lower extremity.  2.  Hypertension.  3.  Diabetes.   PROCEDURES:  1.  Ultrasound guidance for vascular access, right femoral artery.  2.  Catheter placement to left peroneal artery from right femoral approach.  3.  Left lower extremity angiogram.  4.  Percutaneous transluminal angioplasty of distal peroneal artery with 2 mm diameter angioplasty balloon.  5.  Percutaneous transluminal angioplasty of mid to proximal peroneal artery with 3 mm diameter angioplasty balloon.  6.  Percutaneous transluminal angioplasty of below-knee popliteal artery and tibioperoneal trunk with 4 mm diameter angioplasty balloon.  7.  StarClose closure device, right femoral artery.   SURGEON: Annice NeedyJason S Dew, MD  ANESTHESIA: Local with moderate conscious sedation.   ESTIMATED BLOOD LOSS: Minimal.   CONTRAST USED: 55 mL of Visipaque.  INDICATION FOR PROCEDURE: This is an 81 year old white female with limb threatening ischemia of the left lower extremity. She has an ulceration between toes and ischemic rest pain in the left foot.  She is brought in for an angiogram for an attempt at revascularization. She has had a previous revascularization, but her noninvasive studies showed continued diminished flow with only mild improvement after previous revascularization and with flat digital waveforms that would be unlikely to heal any wounds.   DESCRIPTION OF PROCEDURE: Patient as brought to the vascular suite. Groins were shaved and prepped and a sterile surgical field was created.  The right femoral head was localized with fluoroscopy.  The right femoral head was visualized with  ultrasound and found to be patent. It was then accessed with direct ultrasound guidance without difficulty with a Seldinger needle. A J-wire and 5 French sheath were then placed.  A rim catheter was used to cross the aortic bifurcation and advanced to the left femoral head and selective left lower extremity angiogram was then performed. This showed the common femoral artery, profunda femoris artery and superficial femoral artery to have no hemodynamically significant stenosis. The popliteal artery below the knee into the tibioperoneal trunk had a moderate stenosis that appeared in the 50% to 60% range. The anterior tibial artery was chronically occluded. The posterior tibial artery was not seen. The peroneal artery had an occlusion in its mid segment with disease distally. This has been treated previously, but had reoccluded. The patient was systemically heparinized and a 6 [place] Ansell sheath was placed over a Truman advantage wire. Using a CXI catheter and the [facility] advantage wire, I was able to cross the popliteal and TP trunk stenosis in the peroneal occlusion and perform intraluminal flow in the peroneal artery distally. A 3 mm diameter angioplasty balloon was used to treat the occlusion in the mid and proximal peroneal artery. A waist was taken which resolved with angioplasty. The distal peroneal artery which was smaller was treated with a 2 mm diameter angioplasty balloon. Following this, there was significant spasm distally, but the vessel did appear to be patent without significant residual stenosis. The tibioperoneal trunk and distal popliteal artery were then treated with a 4 mm diameter Lutonix drug-coated angioplasty balloon. A waist was taken which resolved with angioplasty. Completion angiogram following this showed this to now to be widely  patent without significant residual stenosis. At this point I terminated the procedure. The sheath was pulled back to the ipsilateral external iliac artery and  oblique arteriogram was performed. StarClose closure device was deployed in the usual fashion with excellent hemostatic result. The patient tolerated the procedure well and was taken the recovery room in stable condition.    ____________________________ Annice Needy, MD jsd:mk D: 09/27/2013 12:03:12 ET T: 09/27/2013 20:09:25 ET JOB#: [id number]  cc: Annice Needy, MD, <Dictator> Dale , MD Annice Needy MD ELECTRONICALLY SIGNED 10/18/2013 9:42

## 2014-11-05 NOTE — Consult Note (Signed)
Brief Consult Note: Diagnosis: superficial gangrene toe left great toe.   Patient was seen by consultant.   Consult note dictated.   Recommend further assessment or treatment.   Comments: dressings ordered.  Do not suggest any procedure.  Electronic Signatures: Epimenio Sarinroxler, Demetric Parslow G (MD)  (Signed 30-Jul-15 16:41)  Authored: Brief Consult Note   Last Updated: 30-Jul-15 16:41 by Epimenio Sarinroxler, Helyn Schwan G (MD)

## 2014-11-05 NOTE — Consult Note (Signed)
CHIEF COMPLAINT and HISTORY:  Subjective/Chief Complaint CC: weakness and diarrhea   History of Present Illness 81 year old female admitted 7/29 with diarrhea x 2-3 weeks and weakness. This history is taken from medical records since she is a poor historian due to her dementia. She has peripheral arterial disease with ulceration of the left great toe. S/p angiogram 01/12/14 with left popliteal angioplasty, angipolasty left ATA, and left peroneal.   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   Urinary Tract Infection:    pacemaker:    HTN:    GERD - Esophageal Reflux:    Goiter:    Hypothyroidism:    Hypercholesterolemia:    Diabetes:    Rectal adenomoa resection:    Partial Thyroidectomy:    hysterectomy:   ALLERGIES:  Allergies:  Sulfa drugs: Hives, Rash  Cipro I.V.: Itching  PCN: Unknown  HOME MEDICATIONS:  Home Medications: Medication Instructions Status  Metoprolol Tartrate 25 mg oral tablet 1 tab(s) orally 2 times a day Active  Plavix 75 mg oral tablet 1 tab(s) orally once a day Active  Advair Diskus 100 mcg-50 mcg inhalation powder 1 puff(s) inhaled 2 times a day Active  levothyroxine 75 mcg (0.075 mg) oral tablet 1 tab(s) orally once a day Active   Family and Social History:  Family History Coronary Artery Disease  Diabetes Mellitus  COPD   Social History negative tobacco, negative ETOH, negative Illicit drugs   Place of Living Home with son   Review of Systems:  ROS Pt not able to provide ROS   Physical Exam:  GEN no acute distress   HEENT PERRL, hearing intact to voice, moist oral mucosa   NECK supple  No masses   RESP normal resp effort  clear BS   CARD regular rate   ABD denies tenderness  normal BS   EXTR negative cyanosis/clubbing   SKIN Left great toe dry gangrene, does not appear infected   PSYCH dementia, not  oriented   LABS:  Laboratory Results: Hepatic:    29-Jul-15 16:26, Comprehensive Metabolic Panel  Bilirubin, Total 0.3  Alkaline Phosphatase 83  46-116  NOTE: New Reference Range  02/01/14  SGPT (ALT) 29  14-63  NOTE: New Reference Range  02/01/14  SGOT (AST) 21  Total Protein, Serum 7.5  Albumin, Serum 3.3  Routine Micro:    29-Jul-15 20:10, Blood Culture  Micro Text Report   BLOOD CULTURE    COMMENT                   NO GROWTH IN 8-12 HOURS     ANTIBIOTIC  Culture Comment   NO GROWTH IN 8-12 HOURS   Result(s) reported on 10 Feb 2014 at 04:00AM.    29-Jul-15 20:24, Blood Culture  Micro Text Report   BLOOD CULTURE    COMMENT                   NO GROWTH IN 8-12 HOURS     ANTIBIOTIC  Culture Comment   NO GROWTH IN 8-12 HOURS   Result(s) reported on 10 Feb 2014 at 04:00AM.  Cardiology:    29-Jul-15 16:14, ECG  Ventricular Rate 140  Atrial Rate 133  QRS Duration 74  QT 296  QTc 451  R Axis 70  T Axis -63  ECG interpretation   Supraventricular tachycardia  Septal  infarct (cited on or before 21-Oct-2010)  ST & T wave abnormality, consider inferior ischemia  Abnormal ECG  When compared with ECG of 01-Jan-2014 16:14,  Previous ECG has undetermined rhythm, needs review  ST nowdepressed in Inferior leads  ST now depressed in Lateral leads  T wave inversion now evident in Inferior leads  ----------unconfirmed----------  Confirmed by OVERREAD, NOT ([id number]), editor PEARSON, BARBARA ([id number]) on 02/10/2014 1:33:07 PM  ECG   Routine Chem:    29-Jul-15 16:26, Comprehensive Metabolic Panel  Glucose, Serum 227  BUN 22  Creatinine (comp) 1.11  Sodium, Serum 138  Potassium, Serum 4.3  Chloride, Serum 101  CO2, Serum 30  Calcium (Total), Serum 8.6  Osmolality (calc) 286  eGFR (African American) 51  eGFR (Non-African American) 44  eGFR values <54m/min/1.73 m2 may be an indication of chronic  kidney disease (CKD).  Calculated eGFR is useful in patients with stable renal  function.  The eGFR calculation will not be reliable in acutely ill patients  when serum creatinine is changing rapidly. It is not useful in   patients on dialysis. The eGFR calculation may not be applicable  to patients at the low and high extremes of body sizes, pregnant  women, and vegetarians.  Anion Gap 7    29-Jul-15 16:26, Lipase  Lipase 64  Result(s) reported on 09 Feb 2014 at [facility]    29-Jul-15 16:26, Magnesium, Serum  Magnesium, Serum 1.8  1.8-2.4  THERAPEUTIC RANGE: 4-7 mg/dL  TOXIC: > 10 mg/dL   -----------------------    30-Jul-15 037:16 Basic Metabolic Panel (w/Total Calcium)  Glucose, Serum 115  BUN 16  Creatinine (comp) 0.80  Sodium, Serum 141  Potassium, Serum 3.9  Chloride, Serum 107  CO2, Serum 28  Calcium (Total), Serum 7.9  Anion Gap 6  Osmolality (calc) 283  eGFR (African American) >60  eGFR (Non-African American) >60  eGFR values <62mmin/1.73 m2 may be an indication of chronic  kidney disease (CKD).  Calculated eGFR is useful in patients with stable renal function.  The eGFR calculation will not be reliable in acutely ill patients  when serum creatinine is changing rapidly. It is not useful in   patients on dialysis. The eGFR calculation may not be applicable  to patients at the low and high extremes of body sizes, pregnant  women, and vegetarians.  Cardiac:    29-Jul-15 16:26, Troponin I  Troponin I < 0.02  0.00-0.05  0.05 ng/mL or less: NEGATIVE   Repeat testing in 3-6 hrs   if clinically indicated.  >0.05 ng/mL: POTENTIAL   MYOCARDIAL INJURY. Repeat   testing in 3-6 hrs if   clinically indicated.  NOTE: An increase or decrease   of 30% or more on serial   testing suggests a   clinically important change  Routine UA:    29-Jul-15 17:32, Urinalysis  Color (UA) Yellow  Clarity (UA) Clear  Glucose (UA) Negative  Bilirubin (UA) Negative  Ketones (UA) Negative  Specific Gravity (UA) 1.020  Blood (UA) Negative  pH (UA) 5.0  Protein  (UA) Negative  Nitrite (UA) Negative  Leukocyte Esterase (UA) Negative  Result(s) reported on 09 Feb 2014 at 05:48PM.  RBC (UA) 4 /HPF  WBC (UA)   NONE SEEN  Bacteria (UA)   NONE SEEN  Epithelial Cells (UA)   NONE SEEN  Hyaline Cast (UA) 5 /LPF  Result(s) reported on 09 Feb 2014 at 05:48PM.  Routine Coag:    29-Jul-15 16:26, Activated PTT  Activated PTT (APTT) 29.1  A HCT value >55% may artifactually increase the APTT. In one study,  the increase was an average of 19%.  Reference: "Effect on Routine and Special Coagulation Testing Values  of Citrate Anticoagulant Adjustment in Patients with High HCT Values."  American Journal of Clinical Pathology 2006;126:400-405.    29-Jul-15 16:26, Prothrombin Time  Prothrombin 13.6  INR 1.1  INR reference interval applies to patients on anticoagulant therapy.  A single INR therapeutic range for coumarins is not optimal for all  indications; however, the suggested range for most indications is  2.0 - 3.0.  Exceptions to the INR Reference Range may include: Prosthetic heart  valves, acute myocardial infarction, prevention of myocardial  infarction, and combinations of aspirin and anticoagulant. The need  for a higher or lower target INR must be assessed individually.  Reference: The Pharmacology and Management of the Vitamin K   antagonists: the seventh ACCP Conference on Antithrombotic and  Thrombolytic Therapy. PNTIR.4431 Sept:126 (3suppl): [id number].  A HCT value >55% may artifactually increase the PT.  In one study,   the increase was an average of 25%.  Reference:  "Effect on Routine and Special Coagulation Testing Values  of Citrate Anticoagulant Adjustment in Patients with High HCT Values."  American Journal of Clinical Pathology 2006;126:400-405.  Routine Hem:    29-Jul-15 16:26, CBC Profile  WBC (CBC) 11.4  RBC (CBC) 4.91  Hemoglobin (CBC) 12.2  Hematocrit (CBC) 39.4  Platelet Count (CBC) 363  MCV 80  MCH 24.8  MCHC 30.9   RDW 18.2  Neutrophil % 55.6  Lymphocyte % 31.6  Monocyte % 7.5  Eosinophil % 4.7  Basophil % 0.6  Neutrophil # 6.3  Lymphocyte # 3.6  Monocyte # 0.9  Eosinophil # 0.5  Basophil # 0.1  Result(s) reported on 09 Feb 2014 at 05:36PM.    30-Jul-15 03:53, CBC Profile  WBC (CBC) 9.2  RBC (CBC) 3.84  Hemoglobin (CBC) 9.7  Hematocrit (CBC) 30.7  Platelet Count (CBC) 279  MCV 80  MCH 25.3  MCHC 31.6  RDW 18.0  Neutrophil % 63.7  Lymphocyte % 23.7  Monocyte % 7.9  Eosinophil % 4.0  Basophil % 0.7  Neutrophil # 5.8  Lymphocyte # 2.2  Monocyte # 0.7  Eosinophil # 0.4  Basophil # 0.1  Result(s) reported on 10 Feb 2014 at 05:08AM.   ASSESSMENT AND PLAN:  Assessment/Admission Diagnosis 81 year old female admitted 7/29 with diarrhea x 2-3 weeks and weakness. This history is taken from medical records since she is a poor historian due to her dementia. She has peripheral arterial disease with ulceration of the left great toe. S/p angiogram 01/12/14 with left popliteal angioplasty, angipolasty left ATA, and left peroneal.   Plan PAD with left great toe gangrene S/p angiogram 7/1 (detailed above) Podiatry consulted for wound care On IV antibiotics D/w Dr. Delana Meyer Recommend outpatient follow up to monitor PAD with noninvasive studies   Electronic Signatures: Su Grand (PA-C)  (Signed 30-Jul-15 18:53)  Authored: Chief Complaint and History, PAST MEDICAL/SURGICAL HISTORY, ALLERGIES, HOME MEDICATIONS, Family and Social History, Review of Systems, Physical Exam, LABS, Assessment and Plan   Last Updated: 30-Jul-15 18:53 by Su Grand (PA-C)

## 2014-11-05 NOTE — Op Note (Signed)
PATIENT NAME:  Kendra Sutton, Kendra Sutton MR#:  [id number] DATE OF BIRTH:  July 22, 1921  DATE OF PROCEDURE:  09/13/2013  PREOPERATIVE DIAGNOSES:  1.  Peripheral arterial rest pain, left lower extremity.  2.  Hypertension.  3.  Cardiac arrhythmias.  4.  Diabetes.   POSTOPERATIVE DIAGNOSES:  1.  Peripheral arterial rest pain, left lower extremity.  2.  Hypertension.  3.  Cardiac arrhythmias.  4.  Diabetes.   PROCEDURES PERFORMED:  1.  Ultrasound guidance for vascular access, right femoral artery.  2.  Catheter placement to left peroneal artery from right femoral approach.  3.  Aortogram and selective left lower extremity angiogram.  4.  Percutaneous transluminal angioplasty of left popliteal artery with 5 mm diameter angioplasty balloon.  5.  Percutaneous transluminal angioplasty of left tibioperoneal trunk with 2.5 mm diameter angioplasty balloon.  6.  Percutaneous transluminal angioplasty of left peroneal artery with 2.5 mm diameter angioplasty balloon.  7.  StarClose closure device, right femoral artery.   SURGEON: Annice NeedyJason S Eisen Robenson, M.D.   ANESTHESIA: Local with moderate conscious sedation.   ESTIMATED BLOOD LOSS: Minimal.   INDICATION FOR PROCEDURE: An 81 year old white female who has ischemic rest pain in the left lower extremity. She is brought in for angiography for further evaluation and potential treatment. Risks and benefits were discussed. Informed consent was obtained.   DESCRIPTION OF PROCEDURE: The patient was brought to the vascular suite. Groins were sterilely prepped and draped, and a sterile surgical field was created. The right femoral artery was visualized under ultrasound and found to be widely patent and then accessed under direct ultrasound guidance without difficulty with Seldinger needle. A J-wire and 5-French sheath were placed. Pigtail catheter was placed near the L1-L2 level. AP aortogram was performed. This showed patent but calcified renal arteries. A very calcified and  tortuous splenic artery was seen. The aorta and iliac segments were widely patent. I then crossed the aortic bifurcation and advanced to the left femoral head.   Selective left lower extremity angiogram was then performed. This demonstrated the common femoral artery and the profunda femoris artery to be patent. The superficial femoral artery had about a 30% to 40% stenosis in the mid segment just below Hunter canal and the popliteal artery were tandem areas of stenosis over about a 5 cm range. They were in the 70% to 80% range and the below-knee popliteal artery also had some moderate stenosis in the 50% range.   The anterior tibial arteries and posterior tibial arteries were chronically occluded. The tibioperoneal trunk had a short segment occlusion and then the peroneal artery reconstituted. Further downstream in a separate distinct lesion, the peroneal artery had a high-grade stenosis in the 80%-to-90% range. This was the best runoff to the foot. The patient was systemically heparinized. A 6-French Ansell sheath was placed over a Terumo Advantage wire and I was able to cross the lesions without difficulty, confirming intraluminal flow in the peroneal artery and then exchanged for a 0.18 wire.   The tibioperoneal lesion was initially treated with a 2.5 mm diameter angioplasty balloon. I then advanced the balloon and treated the peroneal lesion, which was separate and distinct, with a 2.5 mm diameter angioplasty balloon, and then the popliteal lesion was treated with a 5 mm diameter angioplasty balloon.   Completion angiogram following this showed a markedly improved flow. The popliteal artery was now patent with no significant residual stenosis. The tibioperoneal trunk stenosis was less than 20% and the peroneal stenosis was less than 20%.  The flow was markedly improved and I elected to terminate the procedure. The sheath was pulled back to the ipsilateral external iliac artery and oblique arteriogram was  performed. StarClose closure device was deployed in the usual fashion with excellent hemostatic result. The patient tolerated the procedure well and was taken to the recovery room in stable condition.   ____________________________ Annice Needy, MD jsd:np D: 09/13/2013 09:40:55 ET T: 09/13/2013 21:29:50 ET JOB#: [id number]  cc: Annice Needy, MD, <Dictator> Marisue Ivan, MD  Annice Needy MD ELECTRONICALLY SIGNED 09/15/2013 14:38

## 2014-11-05 NOTE — Consult Note (Signed)
PATIENT NAME:  Kendra Sutton, Anwar L MR#:  [id number] DATE OF BIRTH:  1921/11/06  DATE OF CONSULTATION:  02/10/2014  CONSULTING PHYSICIAN:  Rhona RaiderMatthew G. Kerrigan Gombos, DPM  REASON FOR CONSULTATION: Kendra EatonKatie Sutton is an 81 year old female admitted to the hospital with a failure to thrive, worsening diarrhea.   PAST MEDICAL HISTORY: Includes dementia, COPD, hypertension, hypothyroidism, gastric reflux, diabetes, paroxysmal atrial fibrillation, peptic ulcer disease, anemia of chronic disease, diverticulosis, sick sinus syndrome, and likely peripheral arterial disease, possibly undiagnosed.   PAST SURGICAL HISTORY: Includes pacemaker placement, rectal adenoma resection, partial thyroidectomy.   ALLERGIES: CIPRO, PENICILLIN, AND SULFA   CURRENT MEDICATIONS: Include acetaminophen, clopidogrel, docusate, fluticasone inhaler, heparin, insulin sliding scale, levothyroxine, metoprolol, Zosyn 375 mg q.8 hours, Senokot, haloperidol, vancomycin 1000 mg every 36 hours, Lorazepam/Ativan injection p.r.n. for agitation.  Levofloxacin 250 mg every 24 hours was discontinued.   VITAL SIGNS: Include temperature 98.3, pulse 89, respirations 20, blood pressure 122/62, pulse oximetry 86%.   LOWER EXTREMITY EXAM: Pulses are nonpalpable bilaterally. The patient has significant cyanosis to both feet. Dermatologically the patient has a superficial gangrenous area to the distal left hallux. It does not appear to be deep. There is no surrounding cellulitis, no penetrating wounds or spreading cellulitis at this juncture. Tissues around the wound appear to be noninfected at this juncture.   CLINICAL IMPRESSION: Superficial gangrenous changes distal left hallux with no evidence of progressive infection at all.   TREATMENT PLAN:  We will get her started with some dressing changes, utilize some antibiotic mupirocin cream with some enzymatic debriding cream with wet-to-dry saline and see if that gradually sloughs off this gangrenous tissue. If she  is going to be able to sustain or withstand any other type of  invasive procedure she may want to consider a vascular consult before she does anything else. Right now I would not suggest any surgical procedures on this toe.    ____________________________ Rhona RaiderMatthew G. [place] Delauder, DPM mgt:lt D: 02/10/2014 16:35:00 ET T: 02/10/2014 16:44:54 ET JOB#: [id number]  cc: Rhona RaiderMatthew G. Tersa Fotopoulos, DPM, <Dictator> Epimenio SarinMATTHEW G Halleigh Comes MD ELECTRONICALLY SIGNED 03/23/2014 12:53

## 2014-11-05 NOTE — Consult Note (Signed)
   Comments   Met with son, Jeneen Rinks.  Son reiterates that he and his brother made the decision together to make pt a DNR.  Jeneen Rinks states decisions are made by Shanon Brow, but knows that Shanon Brow wants pt to return to [facility] to finish rehab days and then to return home.  Pt is total care and is having decreased food and fluid intake per son and homecaregiver, Butch Penny.  Jeneen Rinks and Butch Penny state Shanon Brow will not be home until tomorrow and will try to get a number to reach him.  Electronic Signatures: Maudry Mayhew (NP)  (Signed 31-Aug-15 10:58)  Authored: Palliative Care Phifer, Izora Gala (MD)  (Signed 31-Aug-15 15:03)  Authored: Palliative Care   Last Updated: 31-Aug-15 15:03 by Phifer, Izora Gala (MD)

## 2014-11-05 NOTE — Consult Note (Signed)
Brief Consult Note: Diagnosis: gangrene left hallux.   Patient was seen by consultant.   Consult note dictated.   Orders entered.   Comments: Start combination of santyl and mupirocin with wet to dry saline dressings to left great toe.  Electronic Signatures: Epimenio Sarinroxler, Ulla Mckiernan G (MD)  (Signed 30-Jul-15 16:39)  Authored: Brief Consult Note   Last Updated: 30-Jul-15 16:39 by Epimenio Sarinroxler, Keijuan Schellhase G (MD)

## 2014-11-05 NOTE — Discharge Summary (Signed)
Dates of Admission and Diagnosis:  Date of Admission 13-Mar-2014   Date of Discharge 16-Mar-2014   Admitting Diagnosis Confusion   Final Diagnosis 1. Advanced dementia 2. Chronic osteomyelitis - rigth great toe 3. Severe proteine calorie malnutrition    Chief Complaint/History of Present Illness CHIEF COMPLAINT: Altered mental status.   HISTORY OF PRESENT ILLNESS: This is a 81 year old female who presents from a skilled nursing facility due to combative behavior and agitation. The patient was recently discharged there a month or so ago and now returns with the above symptoms. The patient herself has advanced dementia, and therefore most of the history obtained from the ER physician is from the chart. The patient apparently has been aggressive at the skilled nursing facility, throwing things and been noncompliant with her treatment and her medications. The patient has a known left great toe peripheral vascular disease ulcer which is not amenable to any treatment at this time. The patient during this visit to the hospital was noted to have a slightly elevated white count and noted to have some redness in the left foot proximal to the ulcer. There was some question of underlying cellulitis, and given mental status change hospitalist services were contacted for further treatment and evaluation.   REVIEW OF SYSTEMS: Unobtainable given the patient's advanced dementia.   Allergies:  Sulfa drugs: Hives, Rash  Cipro I.V.: Itching  PCN: Unknown    Routine Micro:  30-Aug-15 13:57   Micro Text Report BLOOD CULTURE   COMMENT                   NO GROWTH IN 48 HOURS   ANTIBIOTIC                       Culture Comment NO GROWTH IN 66 HOURS  Result(s) reported on 15 Mar 2014 at 07:00PM.    18:59   Micro Text Report BLOOD CULTURE   COMMENT                   NO GROWTH IN 48 HOURS   ANTIBIOTIC                       Culture Comment NO GROWTH IN 72 HOURS  Result(s) reported on 15 Mar 2014 at  07:00PM.  Routine Chem:  30-Aug-15 13:57   Creatinine (comp) 0.92  eGFR (African American) >60  eGFR (Non-African American)  55 (eGFR values <13m/min/1.73 m2 may be an indication of chronic kidney disease (CKD). Calculated eGFR is useful in patients with stable renal function. The eGFR calculation will not be reliable in acutely ill patients when serum creatinine is changing rapidly. It is not useful in  patients on dialysis. The eGFR calculation may not be applicable to patients at the low and high extremes of body sizes, pregnant women, and vegetarians.)  Glucose, Serum  161  BUN 14  Sodium, Serum 143  Potassium, Serum 4.0  Chloride, Serum 103  CO2, Serum 30  Calcium (Total), Serum 8.9  Anion Gap 10  Osmolality (calc) 289  01-Sep-15 05:19   Creatinine (comp) 0.86  eGFR (African American) >60  eGFR (Non-African American)  59 (eGFR values <615mmin/1.73 m2 may be an indication of chronic kidney disease (CKD). Calculated eGFR is useful in patients with stable renal function. The eGFR calculation will not be reliable in acutely ill patients when serum creatinine is changing rapidly. It is not useful in  patients on dialysis. The eGFR calculation  may not be applicable to patients at the low and high extremes of body sizes, pregnant women, and vegetarians.)  Routine UA:  30-Aug-15 16:49   Color (UA) Amber  Clarity (UA) Clear  Glucose (UA) Negative  Bilirubin (UA) Negative  Ketones (UA) 2+  Specific Gravity (UA) 1.015  Blood (UA) Negative  pH (UA) 6.0  Protein (UA) 25 mg/dL  Nitrite (UA) Negative  Leukocyte Esterase (UA) Negative (Result(s) reported on 13 Mar 2014 at 05:03PM.)  RBC (UA) <1 /HPF  WBC (UA) 1 /HPF  Bacteria (UA) NONE SEEN  Epithelial Cells (UA) NONE SEEN  Mucous (UA) PRESENT  Hyaline Cast (UA) 1 /LPF (Result(s) reported on 13 Mar 2014 at 05:03PM.)  Routine Hem:  30-Aug-15 13:57   WBC (CBC)  13.1  RBC (CBC) 4.66  Hemoglobin (CBC)  11.3  Hematocrit  (CBC) 36.7  Platelet Count (CBC)  474  MCV  79  MCH  24.3  MCHC  30.9  RDW  17.5  Neutrophil % 80.5  Lymphocyte % 11.6  Monocyte % 6.5  Eosinophil % 0.7  Basophil % 0.7  Neutrophil #  10.6  Lymphocyte # 1.5  Monocyte # 0.9  Eosinophil # 0.1  Basophil # 0.1 (Result(s) reported on 13 Mar 2014 at 02:33PM.)  Erythrocyte Sed Rate 16 (Result(s) reported on 13 Mar 2014 at 02:33PM.)   PERTINENT RADIOLOGY STUDIES: XRay:    29-Jul-15 20:00, Foot Left Complete  Foot Left Complete   REASON FOR EXAM:    severe diabetic foot ulcer with wet gangrene, r/o   osteomyelitis  COMMENTS:       PROCEDURE: DXR - DXR FOOT LT COMP W/OBLIQUES  - Feb 09 2014  8:00PM     CLINICAL DATA:  First toe open wound    EXAM:  LEFT FOOT - COMPLETE 3+ VIEW    COMPARISON:  11/19/2013    FINDINGS:  Generalized osteopenia is noted. Soft tissue wound is noted the  distal aspect of the first toe. Some mild erosion of the distal  aspect of the distal phalanx is noted. This is suggestive of  localized osteomyelitis. No other focal abnormality is seen.  Vascular calcifications are noted.     IMPRESSION:  Findings suggestive of osteomyelitis in the distal aspect of the  first distal phalanx.      Electronically Signed    By: Inez Catalina M.D.    On: 02/09/2014 20:12       Verified By: Everlene Farrier, M.D.,    30-Aug-15 14:40, Foot Left Complete  Foot Left Complete   REASON FOR EXAM:    great toe necrosis, erythema, concern for osteo  COMMENTS:       PROCEDURE: DXR - DXR FOOT LT COMP W/OBLIQUES  - Mar 13 2014  2:40PM     CLINICAL DATA:  Evaluate for osteomyelitis left great toe.    EXAM:  LEFT FOOT - COMPLETE 3+ VIEW    COMPARISON:  02/09/2014.    FINDINGS:  Soft tissue swelling is noted about the left great toe. Peripheral  vascular calcifications present. Diffuse degenerative change  present. No evidence of fracture dislocation. Again noted are subtle  erosive changes along the distal tuft of  the left great toe.  Osteomyelitis cannot be excluded .     IMPRESSION:  1. Again noted are subtle erosive changes of the distal tuft of the  left great toe. Osteomyelitis cannot be excluded.  2. DJD.  3. Peripheral vascular disease.      Electronically Signed  By: Cut Off    On: 03/13/2014 14:48     Verified By: Osa Craver, M.D., MD    30-Aug-15 17:24, Chest Portable Single View  Chest Portable Single View   REASON FOR EXAM:    AMS, leukocytosis, r/o PNA  COMMENTS:       PROCEDURE: DXR - DXR PORTABLE CHEST SINGLE VIEW  - Mar 13 2014  5:24PM     CLINICAL DATA:  Altered mental status.  Leukocytosis.    EXAM:  PORTABLE CHEST - 1 VIEW    COMPARISON:  02/09/2014    FINDINGS:  There is hyperinflation of the lungs compatible with COPD. Left  pacer remains in place, unchanged. No confluent airspace opacities.  No effusions. No acute bony abnormality.     IMPRESSION:  COPD.  No acute findings.      Electronically Signed    By: Rolm Baptise M.D.    On: 03/13/2014 17:30         Verified By: Raelyn Number, M.D.,  LabUnknown:    29-Jul-15 20:00, Foot Left Complete  PACS Image     30-Aug-15 14:40, Foot Left Complete  PACS Image     30-Aug-15 17:24, Chest Portable Single View  PACS Image    Pertinent Past History:  Pertinent Past History PAST MEDICAL HISTORY: Consistent with dementia, history of peripheral vascular disease with chronic left great toe ulcer, hypertension, COPD, hypothyroidism, GERD.   Hospital Course:  Hospital Course 81 year old female with hypertension, diabetes, peripheral vascular disease, presenting with cellulitis and ulcer of great toe.  * Worsening dementia not very different from baseline. But has been progressively worsening over weeks. On Olanzepine. Much improved. Awake and conversing today. She is expected to have periods of confusion after discharge with her advanced dementia  * Chronic osteomyelitis and PAD Not a  surgical candidate with advanced age and PAD. Will likely need BKA. Also poor nutritional status to heal from surgeries. Discussed with son and he is not keen on surgery  *  Hypertension.    * COPD  * Diabetes mellitus on sliding scale insulin.  Time spent on discharge today 35 min   Condition on Discharge Guarded   Code Status:  Code Status No Code/Do Not Resuscitate   PHYSICAL EXAM ON DISCHARGE:  Physical Exam:  GEN confused   HEENT pale conjunctivae, dry oral mucosa   RESP normal resp effort  clear BS   CARD regular rate   SKIN Gangrene of right great toe and 3rd toe   VITAL SIGNS:  Vital Signs: **Vital Signs.:   02-Sep-15 04:20  Vital Signs Type Routine  Celsius 37  Temperature Source oral  Pulse Pulse 60  Respirations Respirations 20  Systolic BP Systolic BP 703  Diastolic BP (mmHg) Diastolic BP (mmHg) 75  Mean BP 103  Pulse Ox % Pulse Ox % 92  Pulse Ox Activity Level  At rest  Oxygen Delivery Room Air/ 21 %   DISCHARGE INSTRUCTIONS HOME MEDS:  Medication Reconciliation: Patient's Home Medications at Discharge:     Medication Instructions  acetaminophen 325 mg oral tablet  2 tabs (666m) orally every 4 hours as needed for pain   advair diskus 250 mcg-50 mcg inhalation powder  1 puff(s) inhaled 2 times a day for copd.   divalproex sodium 125 mg oral delayed release capsule  1 cap(s) orally in the morning and at bedtime for mood stabilizer.   docusate sodium sodium 100 mg oral tablet  1 tab(s) orally 2 times  a day for constipation.   lorazepam 0.5 mg oral tablet  1 tab(s) orally every 8 hours as needed for anxiety.   metoprolol tartrate 25 mg oral tablet  1 tab(s) orally 2 times a day for HTN.   mupirocin topical 2% topical ointment  Apply topically to left foot toe once a day and evening shift for wound care. apply owith santyl and cover with small coversite.   clopidogrel 75 mg oral tablet  1 tab(s) orally once a day for dvt.   senna 8.6 mg oral tablet   1 tab(s) orally every 12 hours as needed for constipation   levothyroxine 75 mcg (0.075 mg) oral tablet  1 tab(s) orally once a day for prophylaxis.   ranitidine 150 mg oral tablet  1 tab(s) orally 2 times a day for reflux.   santyl 250 units/g topical ointment  Apply topically to left foot toe once a day and evening shift for wound care. apply with mupirocin and cover with small coversite bandage   novolog penfill 100 units/ml subcutaneous solution  inject subcutaneous 4 times a day (before meals and at bedtime) per sliding scale: 0-200=0u, 201-250=4u, 251-300=6 units, 301-350=8u, 351-400=10u, greater than 400=call MD   olanzapine 5 mg oral tablet, disintegrating  1 tab(s) orally once a day (at bedtime)   olanzapine 5 mg oral tablet  1 tab(s) orally once a day (at bedtime)   cipro 250 mg oral tablet  1 tab(s) orally 2 times a day    STOP TAKING THE FOLLOWING MEDICATION(S):    ceftriaxone 1 g injectable powder for injection: 1 gram(s) intramuscular once a day for uti until 03/14/14.  Physician's Instructions:  Diet Pureed diet. Aspiration precautions   Dietary Supplements Ensure, TID w meals   Dietary Supplements Frequency Three times per day   Activity Limitations As tolerated   Return to Work Not Applicable   Time frame for Follow Up Appointment 1-2 weeks  PCP   Electronic Signatures for Addendum Section:  Miria Cappelli, Lottie Dawson (MD) (Signed Addendum 03-Sep-15 14:03)  Since yesterday patient has been waiting for insurance approval to transfer to rehab. Eisodes of confusion. No pain. [place] instructions and meds stay the same  Alba Destine (MD) (Signed Addendum 08-Sep-15 11:43)  Time spent on 03/18/2014 in discharge co-ordination was 35 minutes   Electronic Signatures: Hae Ahlers, Lottie Dawson (MD)  (Signed 02-Sep-15 11:13)  Authored: ADMISSION DATE AND DIAGNOSIS, CHIEF COMPLAINT/HPI, Allergies, PERTINENT LABS, PERTINENT RADIOLOGY STUDIES, PERTINENT PAST HISTORY, HOSPITAL  COURSE, PHYSICAL EXAM ON [place], PATIENT INSTRUCTIONS   Last Updated: 08-Sep-15 11:43 by Alba Destine (MD)

## 2014-11-05 NOTE — H&P (Signed)
PATIENT NAME:  Kendra Sutton, Kendra Sutton MR#:  [id number] DATE OF BIRTH:  07/04/1922  DATE OF ADMISSION:  02/09/2014  ADMITTING PHYSICIAN: Enid Baasadhika Daksh Coates, M.D.   PRIMARY CARE PHYSICIAN: Dale Durhamharlene Scott, M.D.   PRIMARY CARDIOLOGIST: Lamar BlinksBruce J. Kowalski, MD.   CHIEF COMPLAINT: Diarrhea and weakness.   HISTORY OF PRESENT ILLNESS: [name] is an 81 year old Caucasian female with past medical history significant for dementia, COPD on home oxygen, hypertension, hypothyroidism, gastroesophageal reflux disease, presents to the hospital, brought in by son secondary to worsening diarrhea over 2 weeks.  The patient is a very poor historian due to history of dementia and is very confused at baseline. Son actually is not present at bedside at this time. He just left the hospital. Most of the history obtained from the ER physician's  records and also old notes.  It seems like the patient was having diarrhea for 3 weeks, brought into the hospital with complaining of left foot pain, so when the socks were removed, she had an ulcer with redness, erythema, and warmth of the leg. She actually was seen by Dr. Wyn Quakerew and had a procedure done for peripheral vascular disease. She actually had by aortogram and angioplasty done about 4 weeks ago. Denies any fevers or chills. Slightly elevated white count and because of inability to walk and cellulitis picture, she is being admitted.   PAST MEDICAL HISTORY: 1.  Hypertension.  2.  Sick sinus syndrome, status post pacemaker.  3.  Calluses of the leaflets disease.  4.  Hypothyroidism.  5.  Hyperlipidemia.  6.  Dementia.  7.  History of UTI with multiple multidrug resistant organisms.  8.  Diabetes mellitus.  9.  Paroxysmal atrial fibrillation. 10.  Peptic ulcer disease. 11.  Anemia or chronic disease. 12.  Diverticulosis.  13.  Partial thyroidectomy.   PAST SURGICAL HISTORY:  1.  Pacemaker placement.  2.  Rectal adenoma resection.  3.  Partial thyroidectomy.   ALLERGIES TO  MEDICATIONS: CIPRO, PENICILLIN, AND SULFA DRUGS.    CURRENT HOME MEDICATIONS:  Not available at this time.  SOCIAL HISTORY: Lives at home with her son. No smoking, alcohol currently.   FAMILY HISTORY: Significant for COPD, diabetes and heart disease.  REVIEW OF SYSTEMS:  Difficult to be obtained, secondary to the patient's confusion and dementia.   PHYSICAL EXAMINATION: VITAL SIGNS: Temperature 98.3 degrees Fahrenheit, pulse 146, respirations 22, blood pressure 125/59, pulse oximetry 90% on room air when she came in.  GENERAL: Thin built, elderly frail female sitting in bed, not in any acute distress.  HEENT: Normocephalic, atraumatic. Pupils equal, round, reacting to light. Anicteric sclerae. Extraocular movements intact. Oropharynx clear without erythema, mass or icterus.  NECK: Supple.  No thyromegaly, JVD, or carotid bruits. No lymphadenopathy.  LUNGS: Moving air bilaterally. Decreased right basilar breath sounds.  Some scattered wheeze also present.  CARDIOVASCULAR: S1, S2, regular rate and rhythm, 3/6 systolic murmur heard. No rubs or gallops.  ABDOMEN: Soft, nontender, nondistended. No active splenomegaly.  Normal bowel sounds.  EXTREMITIES: No pedal edema. No clubbing or cyanosis. The left leg erythema  extending from the foot up to the leg and there is an open ulcer with necrotic appearance at the tip of the 1st toe.  Good pedal dorsalis pedis pulses palpable bilaterally.  SKIN: No acne, rash or lesions other than the ulcers described above.   LYMPHATIC:  No cervical lymphadenopathy.  NEUROLOGIC: Cranial nerves intact.  No focal motor or sensory deficits.   PSYCHOLOGICAL: The patient is awake, alert,  not oriented at all, but able to maintain conversation and answer questions, but not the right answers.   LABORATORY DATA: WBC 11.5, hemoglobin 12.3, hematocrit 39.4, platelet count 363,000.   Sodium 38, potassium 4.3, chloride 101, bicarbonate 30, BUN 22, creatinine 1.1, glucose of  227, and calcium of 8.6.  ALT 29, AST 21, alkaline phosphatase of 33, total bilirubin 0.3. Albumin of 3.3.  Lipase 64, magnesium 1.8. INR is 1.1. Troponin less than 0.02. Urinalysis negative for any infection. Chest x-ray showing clear lung fields. No acute cardiopulmonary process.  EKG showing sinus tachycardia, heart rate of 140.   ASSESSMENT AND PLAN: An 81 year old female with hypertension, diabetes, peripheral vascular disease, presenting with cellulitis and ulcer of great toe. 1.  Cellulitis and peripheral vascular disease with dry gangrene and ulcer left great toe.  The ulcer is chronic, seen by vascular and recently had a PTA of the left legs done.  X-ray has been noted to rule out osteomyelitis with cellulitis changes.  Would cultures have been ordered. IV vancomycin and Levaquin have been ordered for now until the allergist will be confirmed.  Vascular has  been consulted.  2.  Hypertension.  Home medications need to be verified before restarting medications. 3.  Chronic obstructive pulmonary disease on 2 liters of oxygen, stable.  Continue Advair and DuoNebs. 4.  Diarrhea.  Stool studies and monitor with IV fluids. Electrolytes seem  normal.  5.  Diabetes mellitus, not known what medications she takes at home on sliding scale insulin. 6.  Dementia with confusion, sitter  for tonight. 7.  Deep venous thrombosis prophylaxis.  Will do TEDs and SCDs and subcutaneous heparin.   CODE STATUS: Full code.   TIME SPENT ON ADMISSION: 50 minutes.    ____________________________ Enid Baas, MD rk:ds D: 02/09/2014 20:13:39 ET T: 02/09/2014 20:56:13 ET JOB#: [id number]  cc: Enid Baas, MD, <Dictator> Dale [place], MD Lamar Blinks, MD  Enid Baas MD ELECTRONICALLY SIGNED 03/01/2014 13:48

## 2014-11-05 NOTE — Discharge Summary (Signed)
PATIENT NAME:  Kendra BlazingCOOPER, Liza L MR#:  [id number] DATE OF BIRTH:  [birth year]  DATE OF ADMISSION:  07/06/2013 DATE OF DISCHARGE:  07/08/2013  DISCHARGE DIAGNOSES: 1.  Right lower lobe pneumonia.  2.  Urinary tract infection.  3.  Chronic obstructive pulmonary disease exacerbation.  4.  Delirium with dementia.  5.  Hypothyroidism.  6.  Hypertension.   IMAGING STUDIES: Include a chest x-ray which showed right lower lobe pneumonia.   ADMITTING HISTORY AND PHYSICAL AND HOSPITAL COURSE: Please see detailed H and P dictated previously. In brief, an 81 year old female patient with history of dementia, hypertension, presented to the hospital with weakness, cough, shortness of breath was found to have right lower lobe pneumonia, urinary tract infection, admitted to the hospitalist service.   HOSPITAL COURSE: The patient was started on IV antibiotics, nebulizers, and steroids for her her mild chronic obstructive pulmonary disease exacerbation, pneumonia and urinary tract infection. The patient did improve well. She did have significant delirium over her dementia with sundowning during the two nights she was in the hospital. Received a dose of Ativan initially and later Haldol. I have started her on Seroquel 25 mg oral at bedtime, as the son mentioned that she is back at baseline at the time of discharge, but she tends to do this even at home, where gets extremely confused at night. Today on examination, lungs do not have any wheezing; they are clear. Discussed with son who feels the patient is back to baseline and the patient is being discharged back home in fair condition.   The patient does seem to have poor prognosis with her significant dementia worsening over the past few months, but the patient is still a full code and is high risk for readmission.   DISCHARGE MEDICATIONS: Include:  1.  Metoprolol tartrate 50 mg oral 2 times a day.  2.  Advair Diskus 150 inhaled 2 times a day.  3.  Aspirin 81 mg  daily.  4.  Levothyroxine 75 once a day.  5.  Multivitamin 1 tablet oral once a day.  6.  Anusol-HC 25 mg rectal suppository 2 times a day.  7.  Proctosol HC 2.5 topical cream apply to affected area 2 times a day.  8.  Seroquel 25 mg oral once a day at bedtime.  9.  Erythromycin 500 mg oral once a day for five days.  10.  Ceftin 500 mg oral 2 times a day for six days.  11.  Prednisone 10 mg oral once a day for three days.   DISCHARGE INSTRUCTIONS: Low-salt diet. Activity as tolerated. Follow up with primary care physician Dale Durhamharlene Scott, MD in 1 to 2 weeks.   TIME SPENT: On day of discharge in discharge activity was 40 minutes    ____________________________ Molinda BailiffSrikar R. Braileigh Landenberger, MD srs:cc D: 07/08/2013 15:44:23 ET T: 07/08/2013 19:42:33 ET JOB#: [id number]  cc: Wardell HeathSrikar R. Shalayne Leach, MD, <Dictator> Dale Durhamharlene Scott, MD Orie FishermanSRIKAR R Alexzavier Girardin MD ELECTRONICALLY SIGNED 07/22/2013 11:12

## 2014-11-05 NOTE — Consult Note (Signed)
PATIENT NAME:  Kendra Sutton, Kendra Sutton MR#:  [id number] DATE OF BIRTH:  11/06/1921  DATE OF CONSULTATION:  03/15/2014  REFERRING PHYSICIAN:   CONSULTING PHYSICIAN:  Linus Galas, DPM  REASON FOR CONSULTATION: This is a 81 year old female who recently presented from a skilled nursing facility because of altered mental status. It was noted that the patient did have some gangrenous changes to her left great toe with a history of peripheral vascular disease and ulceration. There was noted to be some significant cellulitis noted in the left foot, as well. Consultation is made to podiatry for evaluation and determination of need for amputation.   PAST MEDICAL HISTORY:  Peripheral vascular disease, advanced dementia, hypertension, chronic obstructive pulmonary disease, hypothyroidism, GERD.  PAST SURGICAL HISTORY: Unobtainable due to the patient's dementia.   HOME MEDICATIONS:  Tylenol 650 mg 2 tablets every 4 hours p.r.n. pain, Advair 250 one puff b.i.d., Plavix 75 mg daily, Depakote 124 mg daily, Colace 100 mg b.i.d., Synthroid 75 mcg daily, lorazepam 0.5 mg q.4 h. p.r.n., metoprolol tartrate 25 mg b.i.d., mupirocin ointment left great toe daily, NovoLog sliding scale, ranitidine 150 mg b.i.d., Santyl ointment to be applied to the left great toe daily, Senokot 1 tablet daily.   ALLERGIES: CIPRO, PENICILLIN, SULFA.   SOCIAL HISTORY: Unobtainable due to the patient's dementia.   FAMILY HISTORY: Unobtainable due to the patient's mental status.   REVIEW OF SYSTEMS: Again, unobtainable due to the patient's dementia.   PHYSICAL EXAMINATION:  VASCULAR: DP pulses are palpable bilateral, but diminished. PT pulse is trace at best bilateral. Not clearly palpated on a regular basis. Capillary filling time is intact on the right foot.  NEUROLOGICAL: Sensation appears to be grossly intact bilateral.  INTEGUMENT: The skin is thin, dry and atrophic bilateral. Cyanotic changes are noted in the left 1st and 4th toes with  significant skin slough and dry gangrenous changes at the tip of the left hallux. There is also noted to be a full-thickness ulceration in the left 3rd interspace at the base of the 3rd and 4th toes. Erythema is noted into the left forefoot and mid foot region. Significant ecchymosis is noted laterally around the ankle and lateral foot.  MUSCULOSKELETAL: Adequate range of motion of the pedal joints. Some guarding because of pain in the left foot. Muscle testing is deferred.   X-RAYS: Multiple views of the left foot reveal some possible cortical erosion of the distal tip of the distal phalanx on the left hallux. Some skin loss is noted on x-ray. Some calcifications are noted in the vascular areas in the left foot and ankle. No specific gas in the tissues.   IMPRESSION:  1. Dry gangrene with probable osteomyelitis, left great toe.  2. Peripheral vascular disease.  3. Advanced dementia.   PLAN: Unable to communicate the patient's condition with her due to her mental status. After reading notes in the chart, it appears that the decision has been made by the family not to opt for any type of amputation and to find placement with palliative care. If any type of procedure were to be pursued, vascular surgery would need to be involved, which we would have to evaluate for the viability of a transmetatarsal amputation versus going straight to a higher up amputation such as a below-knee amputation. At this point, continue with current wound care. We will follow the patient accordingly, as needed, if any further treatment is wished to be pursued by the family.    ____________________________ Linus Galas, DPM tc:JT D:  03/15/2014 13:48:18 ET T: 03/15/2014 15:19:36 ET JOB#: [id number]  cc: Linus Galasodd Correne Lalani, DPM, <Dictator> Jabreel Chimento DPM ELECTRONICALLY SIGNED 03/18/2014 13:56

## 2014-11-05 NOTE — H&P (Signed)
PATIENT NAME:  Kendra Sutton, Kendra Sutton MR#:  [id number] DATE OF BIRTH:  1922-06-15  DATE OF ADMISSION:  03/13/2014  PRIMARY CARE PHYSICIAN: Dale Durhamharlene Scott, MD  CHIEF COMPLAINT: Altered mental status.   HISTORY OF PRESENT ILLNESS: This is a 81 year old female who presents from a skilled nursing facility due to combative behavior and agitation. The patient was recently discharged there a month or so ago and now returns with the above symptoms. The patient herself has advanced dementia, and therefore most of the history obtained from the ER physician is from the chart. The patient apparently has been aggressive at the skilled nursing facility, throwing things and been noncompliant with her treatment and her medications. The patient has a known left great toe peripheral vascular disease ulcer which is not amenable to any treatment at this time. The patient during this visit to the hospital was noted to have a slightly elevated white count and noted to have some redness in the left foot proximal to the ulcer. There was some question of underlying cellulitis, and given mental status change hospitalist services were contacted for further treatment and evaluation.   REVIEW OF SYSTEMS: Unobtainable given the patient's advanced dementia.   PAST MEDICAL HISTORY: Consistent with dementia, history of peripheral vascular disease with chronic left great toe ulcer, hypertension, COPD, hypothyroidism, GERD.   ALLERGIES: CIPRO, PENICILLIN AND SULFA.   SOCIAL HISTORY: Unobtainable given the patient's mental status.  FAMILY HISTORY: Also unobtainable given the patient's mental status.   CURRENT MEDICATIONS: As follows: Tylenol 650 two tablets every 4 hours as needed, Advair 250 one puff b.i.d., Plavix 75 mg daily, Depakote 124 mg daily, Colace 100 mg b.i.d., Synthroid 75 mcg daily, lorazepam 0.5 mg q. 8 hours as needed, metoprolol tartrate 25 mg b.i.d., mupirocin topical ointment to be applied to the left foot toe once a day,  NovoLog sliding scale, ranitidine 150 mg b.i.d., Santyl ointment to be applied once a day to the left foot, Senokot 1 tablet b.i.d.   PHYSICAL EXAMINATION: Presently is as follows:  VITAL SIGNS: Temperature 98.3, pulse 80, respirations 18, blood pressure 145/100, saturation is 93% on room air.  GENERAL: She is a confused female in bed but in no apparent distress.  HEENT: She is atraumatic, normocephalic. Extraocular muscles are intact. Pupils equal and reactive to light. Sclerae anicteric. No conjunctival injection.  NECK: Supple. There is no jugular venous distention. No bruits, no lymphadenopathy, no thyromegaly.  HEART: Regular rate and rhythm. No murmurs, no rubs, no clicks.  LUNGS: Clear to auscultation anteriorly. No rales or rhonchi. No wheezes.  ABDOMEN: Soft, flat, nontender, nondistended. Has good bowel sounds. No hepatosplenomegaly appreciated.  EXTREMITIES: The patient does have a cyanotic looking-appearing left great toe, faint pulses on the left foot as compared to the right. No clubbing, no peripheral edema bilaterally.  NEUROLOGIC: She is alert, awake and oriented x 1. Difficult do a full neurologic exam given her advanced dementia, but moves all extremities spontaneously.  SKIN: Moist and warm. She does have a cellulitic-appearing rash on her left foot proximal to the left great toe ulcer.  LYMPHATIC: There is no cervical or axillary lymphadenopathy.  LABORATORY DATA: Shows a serum glucose of 161, BUN 14, creatinine 0.9, sodium 143, potassium 4, chloride 103, bicarbonate 30. White cell count 13.1, hemoglobin is 11.3, hematocrit 36.7, platelet count of 474,000. Urinalysis within normal limits.   IMAGING: The patient did have an x-ray of the chest done showing COPD with no acute findings. X-ray of the left foot  showing subtle erosive changes in the distal tuft of the left great toe, osteomyelitis cannot be excluded. PVD and DJD.   ASSESSMENT AND PLAN: This is a 81 year old female  with a history of advanced dementia, peripheral vascular disease, status post chronic left great toe ulcer, hypertension, chronic obstructive pulmonary disease, hypothyroidism, gastroesophageal reflux disease, who presented to the hospital due to altered mental status as she was being aggressive at the skilled nursing facility.  1.  Altered mental status/encephalopathy. The exact etiology of this is unclear, but I suspect this is most likely all dementia, although her agitation has gotten worse over the past few days. There is some possibility this could be underlying metabolic encephalopathy from the left foot cellulitis; therefore, I will treat the patient with IV antibiotics and follow her clinically.  2.  Left foot cellulitis. The patient has a chronic necrotic left great toe ulcer, but now has an ascending cellulitis to it. I WILL START THE PATIENT ON IV VANCOMYCIN AND CEFEPIME, AS THE PATIENT IS ALLERGIC TO PENICILLIN. Follow cultures. Follow her clinically. The patient's white cell count is slightly elevated.  3.  Dementia with agitation. Avoid benzodiazepines. Continue her Depakote and p.r.n. Haldol. Continue with the sitter.  4.  Hypothyroidism. Continue Synthroid.  5.  Gastroesophageal reflux disease. Continue ranitidine.  6.  Hypertension. Continue metoprolol.  7.  Chronic obstructive pulmonary disease, no acute exacerbation. Continue with her Advair.   CODE STATUS: The patient is a DNI/DNR. The patient's prognosis is very poor given her advanced dementia. I will get palliative care to discuss goals of care with the family again.   TIME SPENT: Fifty minutes.    ____________________________ Rolly Pancake. Cherlynn Kaiser, MD vjs:TT D: 03/13/2014 19:50:23 ET T: 03/13/2014 20:24:13 ET JOB#: [id number]  cc: Rolly Pancake. Cherlynn Kaiser, MD, <Dictator> Houston Siren MD ELECTRONICALLY SIGNED 03/23/2014 15:50
